# Patient Record
Sex: Male | Born: 1935 | Race: Black or African American | Hispanic: No | Marital: Married | Smoking: Former smoker
Health system: Southern US, Community
[De-identification: ages and names within clinical notes are randomized; demographics above are authoritative.]

## PROBLEM LIST (undated history)

## (undated) DIAGNOSIS — H269 Unspecified cataract: Secondary | ICD-10-CM

## (undated) DIAGNOSIS — I639 Cerebral infarction, unspecified: Secondary | ICD-10-CM

## (undated) DIAGNOSIS — E789 Disorder of lipoprotein metabolism, unspecified: Secondary | ICD-10-CM

## (undated) DIAGNOSIS — E119 Type 2 diabetes mellitus without complications: Secondary | ICD-10-CM

## (undated) DIAGNOSIS — M1990 Unspecified osteoarthritis, unspecified site: Secondary | ICD-10-CM

## (undated) DIAGNOSIS — I10 Essential (primary) hypertension: Secondary | ICD-10-CM

## (undated) DIAGNOSIS — N184 Chronic kidney disease, stage 4 (severe): Secondary | ICD-10-CM

## (undated) DIAGNOSIS — N289 Disorder of kidney and ureter, unspecified: Secondary | ICD-10-CM

## (undated) DIAGNOSIS — R413 Other amnesia: Secondary | ICD-10-CM

## (undated) HISTORY — DX: Cerebral infarction, unspecified: I63.9

## (undated) HISTORY — DX: Other amnesia: R41.3

## (undated) HISTORY — DX: Unspecified osteoarthritis, unspecified site: M19.90

## (undated) HISTORY — DX: Disorder of lipoprotein metabolism, unspecified: E78.9

## (undated) HISTORY — PX: HERNIA REPAIR: SHX51

## (undated) HISTORY — DX: Disorder of kidney and ureter, unspecified: N28.9

## (undated) HISTORY — PX: COLONOSCOPY: SHX174

---
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
## 2000-09-20 ENCOUNTER — Encounter: Payer: Self-pay | Admitting: Family Medicine

## 2000-09-20 ENCOUNTER — Encounter: Admission: RE | Admit: 2000-09-20 | Discharge: 2000-09-20 | Payer: Self-pay | Admitting: Family Medicine

## 2002-01-31 ENCOUNTER — Encounter: Admission: RE | Admit: 2002-01-31 | Discharge: 2002-01-31 | Payer: Self-pay | Admitting: Family Medicine

## 2002-01-31 ENCOUNTER — Encounter: Payer: Self-pay | Admitting: Family Medicine

## 2003-06-29 ENCOUNTER — Encounter: Admission: RE | Admit: 2003-06-29 | Discharge: 2003-06-29 | Payer: Self-pay | Admitting: Family Medicine

## 2003-11-06 ENCOUNTER — Ambulatory Visit (HOSPITAL_COMMUNITY): Admission: RE | Admit: 2003-11-06 | Discharge: 2003-11-06 | Payer: Self-pay | Admitting: Gastroenterology

## 2006-08-13 ENCOUNTER — Ambulatory Visit: Payer: Self-pay

## 2009-06-28 ENCOUNTER — Emergency Department (HOSPITAL_COMMUNITY): Admission: EM | Admit: 2009-06-28 | Discharge: 2009-06-28 | Payer: Self-pay | Admitting: Emergency Medicine

## 2010-01-19 ENCOUNTER — Inpatient Hospital Stay (HOSPITAL_COMMUNITY): Admission: EM | Admit: 2010-01-19 | Discharge: 2010-01-21 | Payer: Self-pay | Admitting: Emergency Medicine

## 2010-04-27 ENCOUNTER — Emergency Department (HOSPITAL_COMMUNITY): Admission: EM | Admit: 2010-04-27 | Discharge: 2010-04-27 | Payer: Self-pay | Admitting: Emergency Medicine

## 2010-10-19 LAB — BASIC METABOLIC PANEL
BUN: 31 mg/dL — ABNORMAL HIGH (ref 6–23)
BUN: 35 mg/dL — ABNORMAL HIGH (ref 6–23)
CO2: 21 mEq/L (ref 19–32)
Calcium: 8.5 mg/dL (ref 8.4–10.5)
Chloride: 111 mEq/L (ref 96–112)
GFR calc non Af Amer: 22 mL/min — ABNORMAL LOW (ref >60)
Glucose, Bld: 123 mg/dL — ABNORMAL HIGH (ref 70–99)
Glucose, Bld: 87 mg/dL (ref 70–99)
Potassium: 4.4 mEq/L (ref 3.5–5.1)
Sodium: 134 mEq/L — ABNORMAL LOW (ref 135–145)

## 2010-10-19 LAB — COMPREHENSIVE METABOLIC PANEL
ALT: 23 U/L (ref 0–53)
Alkaline Phosphatase: 72 U/L (ref 39–117)
BUN: 27 mg/dL — ABNORMAL HIGH (ref 6–23)
Calcium: 8.1 mg/dL — ABNORMAL LOW (ref 8.4–10.5)
GFR calc non Af Amer: 32 mL/min — ABNORMAL LOW (ref >60)
Glucose, Bld: 104 mg/dL — ABNORMAL HIGH (ref 70–99)
Sodium: 139 mEq/L (ref 135–145)
Total Bilirubin: 0.3 mg/dL (ref 0.3–1.2)
Total Protein: 6.2 g/dL (ref 6.0–8.3)

## 2010-10-19 LAB — DIFFERENTIAL
Basophils Absolute: 0 10*3/uL (ref 0.0–0.1)
Lymphocytes Relative: 11 % — ABNORMAL LOW (ref 12–46)
Lymphs Abs: 1.2 10*3/uL (ref 0.7–4.0)
Neutro Abs: 8.3 10*3/uL — ABNORMAL HIGH (ref 1.7–7.7)

## 2010-10-19 LAB — POCT I-STAT, CHEM 8
Creatinine, Ser: 3.1 mg/dL — ABNORMAL HIGH (ref 0.4–1.5)
HCT: 31 % — ABNORMAL LOW (ref 39.0–52.0)
Hemoglobin: 10.5 g/dL — ABNORMAL LOW (ref 13.0–17.0)
Potassium: 4.1 mEq/L (ref 3.5–5.1)
Sodium: 137 mEq/L (ref 135–145)
TCO2: 20 mmol/L (ref 0–100)

## 2010-10-19 LAB — URINALYSIS, MICROSCOPIC ONLY
Glucose, UA: NEGATIVE mg/dL
Protein, ur: NEGATIVE mg/dL

## 2010-10-19 LAB — URINALYSIS, ROUTINE W REFLEX MICROSCOPIC
Glucose, UA: NEGATIVE mg/dL
Ketones, ur: NEGATIVE mg/dL
Nitrite: POSITIVE — AB
Protein, ur: 100 mg/dL — AB
Urobilinogen, UA: 1 mg/dL (ref 0.0–1.0)

## 2010-10-19 LAB — CULTURE, BLOOD (ROUTINE X 2): Culture: NO GROWTH

## 2010-10-19 LAB — CBC
HCT: 27.1 % — ABNORMAL LOW (ref 39.0–52.0)
HCT: 27.4 % — ABNORMAL LOW (ref 39.0–52.0)
Hemoglobin: 9.1 g/dL — ABNORMAL LOW (ref 13.0–17.0)
MCHC: 33.6 g/dL (ref 30.0–36.0)
MCHC: 34.4 g/dL (ref 30.0–36.0)
MCV: 87.7 fL (ref 78.0–100.0)
Platelets: 184 10*3/uL (ref 150–400)
Platelets: 187 10*3/uL (ref 150–400)
RDW: 15 % (ref 11.5–15.5)
RDW: 15.1 % (ref 11.5–15.5)
RDW: 15.3 % (ref 11.5–15.5)
WBC: 11.4 10*3/uL — ABNORMAL HIGH (ref 4.0–10.5)
WBC: 9 10*3/uL (ref 4.0–10.5)

## 2010-10-19 LAB — URINE MICROSCOPIC-ADD ON

## 2010-10-19 LAB — FOLATE: Folate: 6.1 ng/mL

## 2010-10-19 LAB — URINE CULTURE: Colony Count: >=100000

## 2010-10-19 LAB — LIPID PANEL: HDL: 24 mg/dL — ABNORMAL LOW (ref >39)

## 2010-12-19 NOTE — Op Note (Signed)
NAME:  Kenneth Bennett, Kenneth Bennett                         ACCOUNT NO.:  [id number]   MEDICAL RECORD NO.:  [id number]                   PATIENT TYPE:  AMB   LOCATION:  ENDO                                 FACILITY:  [facility]   PHYSICIAN:  Wonda Horner, M.D.                DATE OF BIRTH:  [birth year]   DATE OF PROCEDURE:  11/06/2003  DATE OF DISCHARGE:                                 OPERATIVE REPORT   PROCEDURE PERFORMED:  Colonoscopy.   INDICATIONS FOR PROCEDURE:  Screening.   Informed consent was obtained after explanation of the risks of bleeding,  infection, and perforation.   PREMEDICATIONS:  Fentanyl 75 mcg  IV, Versed 10 mg IV.   DESCRIPTION OF PROCEDURE:  With the patient in the left lateral decubitus  position, a rectal exam was performed and no masses were felt.  The Olympus  colonoscope was inserted into the rectum and advanced around the colon to  the cecum.  Cecal landmarks were identified.  The cecum and ascending colon  were normal.  The transverse colon normal.  The descending colon, sigmoid  and rectum were normal.  The patient tolerated the procedure well without  complications.   IMPRESSION:  Normal colonoscopy to the cecum.                                               Wonda Horner, M.D.    Katherina Mires  D:  11/06/2003  T:  11/07/2003  Job:  [id number]   cc:   Lucianne Lei, M.D.  [phone] [address]  [place]  [place] [address]  Fax: [phone]

## 2011-10-19 DIAGNOSIS — N401 Enlarged prostate with lower urinary tract symptoms: Secondary | ICD-10-CM | POA: Diagnosis not present

## 2011-10-19 DIAGNOSIS — N289 Disorder of kidney and ureter, unspecified: Secondary | ICD-10-CM | POA: Diagnosis not present

## 2011-11-11 DIAGNOSIS — E1110 Type 2 diabetes mellitus with ketoacidosis without coma: Secondary | ICD-10-CM | POA: Diagnosis not present

## 2011-11-11 DIAGNOSIS — E119 Type 2 diabetes mellitus without complications: Secondary | ICD-10-CM | POA: Diagnosis not present

## 2011-11-11 DIAGNOSIS — I10 Essential (primary) hypertension: Secondary | ICD-10-CM | POA: Diagnosis not present

## 2011-11-11 DIAGNOSIS — N289 Disorder of kidney and ureter, unspecified: Secondary | ICD-10-CM | POA: Diagnosis not present

## 2011-11-11 DIAGNOSIS — E785 Hyperlipidemia, unspecified: Secondary | ICD-10-CM | POA: Diagnosis not present

## 2012-03-10 DIAGNOSIS — N289 Disorder of kidney and ureter, unspecified: Secondary | ICD-10-CM | POA: Diagnosis not present

## 2012-03-10 DIAGNOSIS — I10 Essential (primary) hypertension: Secondary | ICD-10-CM | POA: Diagnosis not present

## 2012-03-10 DIAGNOSIS — E785 Hyperlipidemia, unspecified: Secondary | ICD-10-CM | POA: Diagnosis not present

## 2012-03-10 DIAGNOSIS — E1110 Type 2 diabetes mellitus with ketoacidosis without coma: Secondary | ICD-10-CM | POA: Diagnosis not present

## 2012-09-21 DIAGNOSIS — H2510 Age-related nuclear cataract, unspecified eye: Secondary | ICD-10-CM | POA: Diagnosis not present

## 2012-11-11 ENCOUNTER — Other Ambulatory Visit: Payer: Self-pay | Admitting: Ophthalmology

## 2012-11-11 DIAGNOSIS — H2510 Age-related nuclear cataract, unspecified eye: Secondary | ICD-10-CM | POA: Diagnosis not present

## 2012-11-11 NOTE — H&P (Signed)
  Pre-operative History and Physical for Ophthalmic Surgery  Kenneth Bennett 11/11/2012                  Chief Complaint: Decresaed, "blurry" vision left eye. Having difficulty with glare from bright lights when driving at night, difficulty reading.                                 Vision : 20/70  With +0.50 +2.00 x 075   Manifest refraction  Diagnosis: Nuclear Sclerotic Cataract  Allergies not on file  No Known Allergies  Methyldopa 250 mg 2 po QD Crestor 1 po QD  Planned Procedure:                                       Phacoemulsification, Posterior Chamber Intra-ocular Lens Right Eye  There were no vitals filed for this visit.  Pulse: 67         Temp: NE        Resp:  17       ROS: negative  No past medical history on file.  No past surgical history on file.   History   Social History  . Marital Status: Married    Spouse Name: N/A    Number of Children: N/A  . Years of Education: N/A   Occupational History  . Not on file.   Social History Main Topics  . Smoking status: Not on file  . Smokeless tobacco: Not on file  . Alcohol Use: Not on file  . Drug Use: Not on file  . Sexually Active: Not on file   Other Topics Concern  . Not on file   Social History Narrative  . No narrative on file     The following examination is for anesthesia clearance for minimally invasive Ophthalmic surgery. It is primarily to document heart and lung findings and is not intended to elucidate unknown general medical conditions inclusive of abdominal masses, lung lesions, etc.   General Constitution:  within normal limits   Alertness/Orientation:  Person, time place     yes   HEENT:  Eye Findings: Nuclear Sclerotic Cataract                   left eye  Neck: supple without masses  Chest/Lungs: clear to auscultation  Cardiac: Normal S1 and S2 without Murmur, S3 or S4  Neuro: non-focal   Impression: Visually significant Cataract Left with decreased ability to drive at  night and complaint of blurred vision  Planned Procedure:  Phacoemulsification, Posterior Chamber Intraocular Lens Left Eye     Adonis Brook, MD

## 2012-11-15 ENCOUNTER — Encounter (HOSPITAL_COMMUNITY): Payer: Self-pay

## 2012-11-15 ENCOUNTER — Encounter (HOSPITAL_COMMUNITY): Payer: Self-pay | Admitting: *Deleted

## 2012-11-16 ENCOUNTER — Ambulatory Visit (HOSPITAL_COMMUNITY): Payer: Medicare Other

## 2012-11-16 ENCOUNTER — Encounter (HOSPITAL_COMMUNITY): Payer: Self-pay | Admitting: Anesthesiology

## 2012-11-16 ENCOUNTER — Encounter (HOSPITAL_COMMUNITY)
Admission: RE | Disposition: A | Discharge status: Home / Self Care | Payer: Self-pay | Source: Ambulatory Visit | Attending: Ophthalmology

## 2012-11-16 ENCOUNTER — Ambulatory Visit (HOSPITAL_COMMUNITY): Payer: Medicare Other | Admitting: Anesthesiology

## 2012-11-16 ENCOUNTER — Ambulatory Visit (HOSPITAL_COMMUNITY)
Admission: RE | Admit: 2012-11-16 | Discharge: 2012-11-16 | Disposition: A | Discharge status: Home / Self Care | Payer: Medicare Other | Source: Ambulatory Visit | Attending: Ophthalmology | Admitting: Ophthalmology

## 2012-11-16 ENCOUNTER — Encounter (HOSPITAL_COMMUNITY): Payer: Self-pay | Admitting: *Deleted

## 2012-11-16 DIAGNOSIS — H2510 Age-related nuclear cataract, unspecified eye: Secondary | ICD-10-CM | POA: Diagnosis not present

## 2012-11-16 HISTORY — DX: Essential (primary) hypertension: I10

## 2012-11-16 HISTORY — DX: Unspecified cataract: H26.9

## 2012-11-16 HISTORY — DX: Type 2 diabetes mellitus without complications: E11.9

## 2012-11-16 HISTORY — PX: CATARACT EXTRACTION W/PHACO: SHX586

## 2012-11-16 LAB — BASIC METABOLIC PANEL
BUN: 33 mg/dL — ABNORMAL HIGH (ref 6–23)
Calcium: 9.9 mg/dL (ref 8.4–10.5)
Creatinine, Ser: 2.13 mg/dL — ABNORMAL HIGH (ref 0.50–1.35)
GFR calc Af Amer: 33 mL/min — ABNORMAL LOW (ref >90)
GFR calc non Af Amer: 28 mL/min — ABNORMAL LOW (ref >90)
Glucose, Bld: 103 mg/dL — ABNORMAL HIGH (ref 70–99)
Potassium: 4.8 mEq/L (ref 3.5–5.1)

## 2012-11-16 LAB — GLUCOSE, CAPILLARY: Glucose-Capillary: 75 mg/dL (ref 70–99)

## 2012-11-16 LAB — CBC
Hemoglobin: 13 g/dL (ref 13.0–17.0)
MCH: 29 pg (ref 26.0–34.0)
MCHC: 34.9 g/dL (ref 30.0–36.0)
RDW: 14 % (ref 11.5–15.5)

## 2012-11-16 SURGERY — PHACOEMULSIFICATION, CATARACT, WITH IOL INSERTION
Anesthesia: Monitor Anesthesia Care | Site: Eye | Laterality: Left | Wound class: Clean

## 2012-11-16 MED ORDER — EPINEPHRINE HCL 1 MG/ML IJ SOLN
INTRAOCULAR | Status: DC | PRN
  Administered 2012-11-16: 14:00:00

## 2012-11-16 MED ORDER — FENTANYL CITRATE 0.05 MG/ML IJ SOLN
INTRAMUSCULAR | Status: DC | PRN
  Administered 2012-11-16: 25 ug via INTRAVENOUS
  Administered 2012-11-16: 50 ug via INTRAVENOUS
  Administered 2012-11-16: 25 ug via INTRAVENOUS

## 2012-11-16 MED ORDER — LACTATED RINGERS IV SOLN
INTRAVENOUS | Status: DC | PRN
  Administered 2012-11-16: 14:00:00 via INTRAVENOUS

## 2012-11-16 MED ORDER — DEXAMETHASONE SODIUM PHOSPHATE 10 MG/ML IJ SOLN
INTRAMUSCULAR | Status: AC
  Filled 2012-11-16: qty 1

## 2012-11-16 MED ORDER — DEXAMETHASONE SODIUM PHOSPHATE 10 MG/ML IJ SOLN
INTRAMUSCULAR | Status: DC | PRN
  Administered 2012-11-16: 10 mg

## 2012-11-16 MED ORDER — LIDOCAINE HCL 2 % IJ SOLN
INTRAMUSCULAR | Status: AC
  Filled 2012-11-16: qty 20

## 2012-11-16 MED ORDER — PREDNISOLONE ACETATE 1 % OP SUSP
1.0000 [drp] | OPHTHALMIC | Status: AC
  Administered 2012-11-16: 1 [drp] via OPHTHALMIC

## 2012-11-16 MED ORDER — TETRACAINE HCL 0.5 % OP SOLN
OPHTHALMIC | Status: AC
  Filled 2012-11-16: qty 2

## 2012-11-16 MED ORDER — PROVISC 10 MG/ML IO SOLN
INTRAOCULAR | Status: DC | PRN
  Administered 2012-11-16: .85 mL via INTRAOCULAR

## 2012-11-16 MED ORDER — LIDOCAINE HCL 2 % IJ SOLN
INTRAMUSCULAR | Status: DC | PRN
  Administered 2012-11-16: 14:00:00 via RETROBULBAR

## 2012-11-16 MED ORDER — MUPIROCIN 2 % EX OINT
TOPICAL_OINTMENT | CUTANEOUS | Status: AC
  Filled 2012-11-16: qty 22

## 2012-11-16 MED ORDER — EPINEPHRINE HCL 1 MG/ML IJ SOLN
INTRAMUSCULAR | Status: AC
  Filled 2012-11-16: qty 1

## 2012-11-16 MED ORDER — HYPROMELLOSE (GONIOSCOPIC) 2.5 % OP SOLN
OPHTHALMIC | Status: DC | PRN
  Administered 2012-11-16: 2 [drp] via OPHTHALMIC

## 2012-11-16 MED ORDER — WATER FOR IRRIGATION, STERILE IR SOLN
Status: DC | PRN
  Administered 2012-11-16: 1000 mL

## 2012-11-16 MED ORDER — BSS IO SOLN
INTRAOCULAR | Status: AC
  Filled 2012-11-16: qty 500

## 2012-11-16 MED ORDER — BUPIVACAINE HCL (PF) 0.75 % IJ SOLN
INTRAMUSCULAR | Status: AC
  Filled 2012-11-16: qty 10

## 2012-11-16 MED ORDER — SODIUM HYALURONATE 10 MG/ML IO SOLN
INTRAOCULAR | Status: AC
  Filled 2012-11-16: qty 0.85

## 2012-11-16 MED ORDER — NA CHONDROIT SULF-NA HYALURON 40-30 MG/ML IO SOLN
INTRAOCULAR | Status: DC | PRN
  Administered 2012-11-16 (×2): 0.5 mL via INTRAOCULAR

## 2012-11-16 MED ORDER — ACETAZOLAMIDE SODIUM 500 MG IJ SOLR
INTRAMUSCULAR | Status: AC
  Filled 2012-11-16: qty 500

## 2012-11-16 MED ORDER — NA CHONDROIT SULF-NA HYALURON 40-30 MG/ML IO SOLN
INTRAOCULAR | Status: AC
  Filled 2012-11-16: qty 0.5

## 2012-11-16 MED ORDER — CEFAZOLIN SUBCONJUNCTIVAL INJECTION 100 MG/0.5 ML
200.0000 mg | INJECTION | SUBCONJUNCTIVAL | Status: DC
  Filled 2012-11-16: qty 1

## 2012-11-16 MED ORDER — LACTATED RINGERS IV SOLN
INTRAVENOUS | Status: DC
  Administered 2012-11-16: 13:00:00 via INTRAVENOUS

## 2012-11-16 MED ORDER — CEFAZOLIN SUBCONJUNCTIVAL INJECTION 100 MG/0.5 ML
INJECTION | SUBCONJUNCTIVAL | Status: DC | PRN
  Administered 2012-11-16: 200 mg via SUBCONJUNCTIVAL

## 2012-11-16 MED ORDER — TRIAMCINOLONE ACETONIDE 40 MG/ML IJ SUSP
INTRAMUSCULAR | Status: AC
  Filled 2012-11-16: qty 5

## 2012-11-16 MED ORDER — PHENYLEPHRINE HCL 2.5 % OP SOLN
OPHTHALMIC | Status: AC
  Filled 2012-11-16: qty 3

## 2012-11-16 MED ORDER — TETRACAINE HCL 0.5 % OP SOLN
OPHTHALMIC | Status: DC | PRN
  Administered 2012-11-16: 2 [drp] via OPHTHALMIC

## 2012-11-16 MED ORDER — PHENYLEPHRINE HCL 2.5 % OP SOLN
1.0000 [drp] | OPHTHALMIC | Status: AC | PRN
  Administered 2012-11-16 (×3): 1 [drp] via OPHTHALMIC

## 2012-11-16 MED ORDER — HYPROMELLOSE (GONIOSCOPIC) 2.5 % OP SOLN
OPHTHALMIC | Status: AC
  Filled 2012-11-16: qty 15

## 2012-11-16 MED ORDER — BACITRACIN-POLYMYXIN B 500-10000 UNIT/GM OP OINT
TOPICAL_OINTMENT | OPHTHALMIC | Status: AC
  Filled 2012-11-16: qty 3.5

## 2012-11-16 MED ORDER — 0.9 % SODIUM CHLORIDE (POUR BTL) OPTIME
TOPICAL | Status: DC | PRN
  Administered 2012-11-16: 1000 mL

## 2012-11-16 MED ORDER — CEFAZOLIN SUBCONJUNCTIVAL INJECTION 100 MG/0.5 ML
100.0000 mg | INJECTION | SUBCONJUNCTIVAL | Status: DC
  Filled 2012-11-16: qty 0.5

## 2012-11-16 MED ORDER — PREDNISOLONE ACETATE 1 % OP SUSP
OPHTHALMIC | Status: AC
  Filled 2012-11-16: qty 5

## 2012-11-16 MED ORDER — GATIFLOXACIN 0.5 % OP SOLN
1.0000 [drp] | OPHTHALMIC | Status: AC | PRN
  Administered 2012-11-16 (×3): 1 [drp] via OPHTHALMIC
  Filled 2012-11-16: qty 2.5

## 2012-11-16 MED ORDER — PROPOFOL 10 MG/ML IV BOLUS
INTRAVENOUS | Status: DC | PRN
  Administered 2012-11-16 (×2): 20 mg via INTRAVENOUS
  Administered 2012-11-16: 40 mg via INTRAVENOUS

## 2012-11-16 MED ORDER — TETRACAINE HCL 0.5 % OP SOLN
1.0000 [drp] | OPHTHALMIC | Status: AC
  Administered 2012-11-16: 1 [drp] via OPHTHALMIC

## 2012-11-16 SURGICAL SUPPLY — 62 items
APPLICATOR COTTON TIP 6IN STRL (MISCELLANEOUS) ×2 IMPLANT
APPLICATOR DR MATTHEWS STRL (MISCELLANEOUS) ×2 IMPLANT
BAG MINI COLL DRAIN (WOUND CARE) ×2 IMPLANT
BLADE EYE MINI 60D BEAVER (BLADE) IMPLANT
BLADE KERATOME 2.75 (BLADE) ×2 IMPLANT
BLADE STAB KNIFE 15DEG (BLADE) IMPLANT
CANNULA ANTERIOR CHAMBER 27GA (MISCELLANEOUS) IMPLANT
CLOTH BEACON ORANGE TIMEOUT ST (SAFETY) ×2 IMPLANT
DRAPE OPHTHALMIC 77X100 STRL (CUSTOM PROCEDURE TRAY) ×2 IMPLANT
DRAPE POUCH INSTRU U-SHP 10X18 (DRAPES) ×2 IMPLANT
DRSG TEGADERM 4X4.75 (GAUZE/BANDAGES/DRESSINGS) ×2 IMPLANT
FILTER BLUE MILLIPORE (MISCELLANEOUS) IMPLANT
GLOVE ECLIPSE 6.5 STRL STRAW (GLOVE) ×2 IMPLANT
GLOVE SS BIOGEL STRL SZ 6.5 (GLOVE) ×1 IMPLANT
GLOVE SUPERSENSE BIOGEL SZ 6.5 (GLOVE) ×1
GOWN SRG XL XLNG 56XLVL 4 (GOWN DISPOSABLE) ×1 IMPLANT
GOWN STRL NON-REIN LRG LVL3 (GOWN DISPOSABLE) ×2 IMPLANT
GOWN STRL NON-REIN XL XLG LVL4 (GOWN DISPOSABLE) ×1
KIT BASIN OR (CUSTOM PROCEDURE TRAY) ×2 IMPLANT
KIT ROOM TURNOVER OR (KITS) IMPLANT
KNIFE GRIESHABER SHARP 2.5MM (MISCELLANEOUS) ×2 IMPLANT
LENS IOL ACRYSOF MP POST 17.5 (Intraocular Lens) ×2 IMPLANT
MASK EYE SHIELD (GAUZE/BANDAGES/DRESSINGS) ×2 IMPLANT
NEEDLE 18GX1X1/2 (RX/OR ONLY) (NEEDLE) ×4 IMPLANT
NEEDLE 22X1 1/2 (OR ONLY) (NEEDLE) ×2 IMPLANT
NEEDLE 25GX 5/8IN NON SAFETY (NEEDLE) ×2 IMPLANT
NEEDLE FILTER BLUNT 18X 1/2SAF (NEEDLE) ×1
NEEDLE FILTER BLUNT 18X1 1/2 (NEEDLE) ×1 IMPLANT
NEEDLE HYPO 30X.5 LL (NEEDLE) ×4 IMPLANT
NS IRRIG 1000ML POUR BTL (IV SOLUTION) ×2 IMPLANT
PACK CATARACT CUSTOM (CUSTOM PROCEDURE TRAY) ×2 IMPLANT
PACK CATARACT MCHSCP (PACKS) ×2 IMPLANT
PACK COMBINED CATERACT/VIT 23G (OPHTHALMIC RELATED) IMPLANT
PAD ARMBOARD 7.5X6 YLW CONV (MISCELLANEOUS) ×2 IMPLANT
PAD EYE OVAL STERILE LF (GAUZE/BANDAGES/DRESSINGS) ×2 IMPLANT
PHACO TIP KELMAN 45DEG (TIP) IMPLANT
PROBE ANTERIOR 20G W/INFUS NDL (MISCELLANEOUS) IMPLANT
RING MALYGIN (MISCELLANEOUS) IMPLANT
ROLLS DENTAL (MISCELLANEOUS) IMPLANT
SHUTTLE MONARCH TYPE A (NEEDLE) ×2 IMPLANT
SOLUTION ANTI FOG 6CC (MISCELLANEOUS) IMPLANT
SPEAR EYE SURG WECK-CEL (MISCELLANEOUS) ×2 IMPLANT
SUT ETHILON 10-0 CS-B-6CS-B-6 (SUTURE)
SUT ETHILON 5 0 P 3 18 (SUTURE)
SUT ETHILON 9 0 TG140 8 (SUTURE) IMPLANT
SUT NYLON ETHILON 5-0 P-3 1X18 (SUTURE) IMPLANT
SUT PLAIN 6 0 TG1408 (SUTURE) IMPLANT
SUT POLY NON ABSORB 10-0 8 STR (SUTURE) IMPLANT
SUT VICRYL 6 0 S 29 12 (SUTURE) IMPLANT
SUTURE EHLN 10-0 CS-B-6CS-B-6 (SUTURE) IMPLANT
SYR 20CC LL (SYRINGE) IMPLANT
SYR 5ML LL (SYRINGE) IMPLANT
SYR TB 1ML LUER SLIP (SYRINGE) IMPLANT
SYRINGE 10CC LL (SYRINGE) IMPLANT
TAPE SURG TRANSPORE 1 IN (GAUZE/BANDAGES/DRESSINGS) ×1 IMPLANT
TAPE SURGICAL TRANSPORE 1 IN (GAUZE/BANDAGES/DRESSINGS) ×1
TIP ABS 45DEG FLARED 0.9MM (TIP) ×2 IMPLANT
TOWEL OR 17X24 6PK STRL BLUE (TOWEL DISPOSABLE) ×4 IMPLANT
WATER STERILE IRR 1000ML POUR (IV SOLUTION) ×2 IMPLANT
WIPE INSTRUMENT ADHESIVE BACK (MISCELLANEOUS) ×2 IMPLANT
WIPE INSTRUMENT VISIWIPE 73X73 (MISCELLANEOUS) ×2 IMPLANT
lens (Intraocular Lens) ×2 IMPLANT

## 2012-11-16 NOTE — Transfer of Care (Signed)
Immediate Anesthesia Transfer of Care Note  Patient: Kenneth Bennett  Procedure(s) Performed: Procedure(s): CATARACT EXTRACTION PHACO AND INTRAOCULAR LENS PLACEMENT (I[facility]) (Left)  Patient Location: PACU  Anesthesia Type:MAC  Level of Consciousness: awake, alert , oriented and sedated  Airway & Oxygen Therapy: Patient Spontanous Breathing  Post-op Assessment: Report given to PACU RN, Post -op Vital signs reviewed and stable and Patient moving all extremities  Post vital signs: Reviewed and stable  Complications: No apparent anesthesia complications

## 2012-11-16 NOTE — Preoperative (Signed)
Beta Blockers   Reason not to administer Beta Blockers:Not Applicable 

## 2012-11-16 NOTE — Interval H&P Note (Signed)
History and Physical Interval Note:  11/16/2012 1:08 PM  Kenneth Bennett  has presented today for surgery, with the diagnosis of Nuclear Cataract Left Eye  The various methods of treatment have been discussed with the patient and family. After consideration of risks, benefits and other options for treatment, the patient has consented to  Procedure(s): CATARACT EXTRACTION PHACO AND INTRAOCULAR LENS PLACEMENT ([place]) (Left) as a surgical intervention .  The patient's history has been reviewed, patient examined, no change in status, stable for surgery.  I have reviewed the patient's chart and labs.  Questions were answered to the patient's satisfaction.    As above , vision remains best corrected at 20/70 Left Eye. The AScan IOL measurement indicates a lens power of +17.50 for emmetropic correction OS. Risks, benefits and alternatives to Cataract surgery have been discussed and the patient strongly desires to proceed with Cataract surgery OS.    [name], Lavella Hammock

## 2012-11-16 NOTE — Op Note (Signed)
Kenneth Bennett 11/16/2012 Cataract: Combined, Nuclear  Procedure: Phacoemulsification, Posterior Chamber Intra-ocular Lens Operative Eye:  left eye  Surgeon: Adonis Brook Estimated Blood Loss: minimal Specimens for Pathology:  None Complications: none  The patient was prepared and draped in the usual manner for ocular surgery on the left eye. A Cook lid speculum was placed. A peripheral clear corneal incision was made at the surgical limbus centered at the 11:00 meridian. A separate clear corneal stab incision was made with a 15 degree blade at the 2:00 meridian to permit bi-manual technique. Viscoat and  Provisc as an underlying layer next to the capsule was instilled into the anterior chamber through that incision.  A keratome was used to create a self sealing incision entering the anterior chamber at the 11:00 meridian. A capsulorhexis was performed using a bent 25g needle. The lens was hydrodissected and the nucleus was hydrodilineated using a Nichammin cannula. The Chang chopper was inserted and used to rotate the lens to insure adequate lens mobility. The phacoemulsification handpiece was inserted and a combined phaco-chop technique was employed, fracturing the lens into separate sections with subsequent removal with the phaco handpiece.   The I/A cannula was used to remove remaining lens cortex. Provisc was instilled and used to deepen the anterior chamber and posterior capsule bag. The Monarch injector was used to place a folded Acrysof MA50BM PC IOL, + 17.50  diopters, into the capsule bag. A Sinskey lens hook was used to dial in the trailing haptic.  The I/A cannula was used to remove the viscoelastic from the anterior chamber. BSS was used to bring IOP to the desired range and the wound was checked to insure it was watertight. Subconjunctival injections of Ancef 100/0.39ml and Dexamethasone 0.5 ml of a 10mg /28ml solution were placed without complication. The lid speculum and drapes were  removed and the patient's eye was patched with Polymixin/Bacitracin ophthalmic ointment. An eye shield was placed and the patient was transferred alert and conversant from the operating room to the post-operative recovery area.   Adonis Brook, MD

## 2012-11-16 NOTE — Anesthesia Procedure Notes (Signed)
Procedure Name: MAC Date/Time: 11/16/2012 1:49 PM Performed by: Scheryl Darter Pre-anesthesia Checklist: Patient identified, Emergency Drugs available, Suction available, Patient being monitored and Timeout performed Patient Re-evaluated:Patient Re-evaluated prior to inductionOxygen Delivery Method: Nasal cannula Intubation Type: IV induction Placement Confirmation: positive ETCO2

## 2012-11-16 NOTE — H&P (View-Only) (Signed)
  Pre-operative History and Physical for Ophthalmic Surgery  Kenneth Bennett 11/11/2012                  Chief Complaint: Decresaed, "blurry" vision left eye. Having difficulty with glare from bright lights when driving at night, difficulty reading.                                 Vision : 20/70  With +0.50 +2.00 x 075   Manifest refraction  Diagnosis: Nuclear Sclerotic Cataract  Allergies not on file  No Known Allergies  Methyldopa 250 mg 2 po QD Crestor 1 po QD  Planned Procedure:                                       Phacoemulsification, Posterior Chamber Intra-ocular Lens Right Eye  There were no vitals filed for this visit.  Pulse: 67         Temp: NE        Resp:  17       ROS: negative  No past medical history on file.  No past surgical history on file.   History   Social History  . Marital Status: Married    Spouse Name: N/A    Number of Children: N/A  . Years of Education: N/A   Occupational History  . Not on file.   Social History Main Topics  . Smoking status: Not on file  . Smokeless tobacco: Not on file  . Alcohol Use: Not on file  . Drug Use: Not on file  . Sexually Active: Not on file   Other Topics Concern  . Not on file   Social History Narrative  . No narrative on file     The following examination is for anesthesia clearance for minimally invasive Ophthalmic surgery. It is primarily to document heart and lung findings and is not intended to elucidate unknown general medical conditions inclusive of abdominal masses, lung lesions, etc.   General Constitution:  within normal limits   Alertness/Orientation:  Person, time place     yes   HEENT:  Eye Findings: Nuclear Sclerotic Cataract                   left eye  Neck: supple without masses  Chest/Lungs: clear to auscultation  Cardiac: Normal S1 and S2 without Murmur, S3 or S4  Neuro: non-focal   Impression: Visually significant Cataract Left with decreased ability to drive at  night and complaint of blurred vision  Planned Procedure:  Phacoemulsification, Posterior Chamber Intraocular Lens Left Eye     Adonis Brook, MD

## 2012-11-16 NOTE — Anesthesia Postprocedure Evaluation (Signed)
Anesthesia Post Note  Patient: Kenneth Bennett  Procedure(s) Performed: Procedure(s) (LRB): CATARACT EXTRACTION PHACO AND INTRAOCULAR LENS PLACEMENT ([facility]) (Left)  Anesthesia type: MAC  Patient location: PACU  Post pain: Pain level controlled  Post assessment: Patient's Cardiovascular Status Stable  Last Vitals:  Filed Vitals:   11/16/12 1533  BP: 142/73  Pulse: 62  Temp: 36.7 C  Resp: 16    Post vital signs: Reviewed and stable  Level of consciousness: sedated  Complications: No apparent anesthesia complications

## 2012-11-16 NOTE — Anesthesia Preprocedure Evaluation (Signed)
Anesthesia Evaluation    Reviewed: Allergy & Precautions, H&P , NPO status , Patient's Chart, lab work & pertinent test results  History of Anesthesia Complications Negative for: history of anesthetic complications  Airway       Dental   Pulmonary neg pulmonary ROS,          Cardiovascular hypertension, Pt. on medications     Neuro/Psych negative neurological ROS  negative psych ROS   GI/Hepatic Neg liver ROS,   Endo/Other  diabetes  Renal/GU negative Renal ROS     Musculoskeletal   Abdominal   Peds  Hematology negative hematology ROS (+)   Anesthesia Other Findings   Reproductive/Obstetrics                           Anesthesia Physical Anesthesia Plan  ASA: III  Anesthesia Plan: MAC   Post-op Pain Management:    Induction: Intravenous  Airway Management Planned: Simple Face Mask  Additional Equipment:   Intra-op Plan:   Post-operative Plan:   Informed Consent:   Plan Discussed with: CRNA, Anesthesiologist and Surgeon  Anesthesia Plan Comments:         Anesthesia Quick Evaluation

## 2012-11-17 ENCOUNTER — Encounter (HOSPITAL_COMMUNITY): Payer: Self-pay | Admitting: Ophthalmology

## 2013-01-11 ENCOUNTER — Emergency Department (HOSPITAL_COMMUNITY): Payer: Medicare Other

## 2013-01-11 ENCOUNTER — Encounter (HOSPITAL_COMMUNITY): Payer: Self-pay | Admitting: *Deleted

## 2013-01-11 ENCOUNTER — Emergency Department (HOSPITAL_COMMUNITY)
Admission: EM | Admit: 2013-01-11 | Discharge: 2013-01-11 | Disposition: A | Discharge status: Home / Self Care | Payer: Medicare Other | Attending: Emergency Medicine | Admitting: Emergency Medicine

## 2013-01-11 DIAGNOSIS — E119 Type 2 diabetes mellitus without complications: Secondary | ICD-10-CM | POA: Diagnosis not present

## 2013-01-11 DIAGNOSIS — I10 Essential (primary) hypertension: Secondary | ICD-10-CM | POA: Diagnosis not present

## 2013-01-11 DIAGNOSIS — Z79899 Other long term (current) drug therapy: Secondary | ICD-10-CM | POA: Insufficient documentation

## 2013-01-11 DIAGNOSIS — R079 Chest pain, unspecified: Secondary | ICD-10-CM | POA: Diagnosis not present

## 2013-01-11 DIAGNOSIS — Z87891 Personal history of nicotine dependence: Secondary | ICD-10-CM | POA: Insufficient documentation

## 2013-01-11 DIAGNOSIS — Z9849 Cataract extraction status, unspecified eye: Secondary | ICD-10-CM | POA: Insufficient documentation

## 2013-01-11 DIAGNOSIS — R0789 Other chest pain: Secondary | ICD-10-CM | POA: Diagnosis not present

## 2013-01-11 DIAGNOSIS — Z7982 Long term (current) use of aspirin: Secondary | ICD-10-CM | POA: Insufficient documentation

## 2013-01-11 LAB — BASIC METABOLIC PANEL
Calcium: 9.4 mg/dL (ref 8.4–10.5)
GFR calc non Af Amer: 30 mL/min — ABNORMAL LOW (ref >90)
Sodium: 138 mEq/L (ref 135–145)

## 2013-01-11 LAB — CBC
MCH: 28.8 pg (ref 26.0–34.0)
MCHC: 33.7 g/dL (ref 30.0–36.0)
Platelets: 179 10*3/uL (ref 150–400)

## 2013-01-11 LAB — POCT I-STAT TROPONIN I: Troponin i, poc: 0 ng/mL (ref 0.00–0.08)

## 2013-01-11 NOTE — ED Notes (Signed)
PT states that he had this pain that came up under his left axilla area and now just has some slight heaviness.  Pt denies sob, but felt nauseated.

## 2013-01-11 NOTE — ED Notes (Signed)
Pt. returned from XR. 

## 2013-01-11 NOTE — ED Notes (Signed)
Pt transported to XR.  

## 2013-01-11 NOTE — ED Notes (Signed)
Pt states understanding of discharge instructions 

## 2013-01-11 NOTE — ED Provider Notes (Signed)
History     CSN: [id number]  Arrival date & time 01/11/13  1433   First MD Initiated Contact with Patient 01/11/13 1746      Chief Complaint  Patient presents with  . Chest Pain    (Consider location/radiation/quality/duration/timing/severity/associated sxs/prior treatment) HPI Patient presents with concerns of chest pain.  Pain began today, though he has had similar episodes for the past few weeks.  The pain is focally about the left upper chest, left axilla. The pain is described as heavy, nonradiating, with no clear precipitant or Episodes are brief, occur without specific prodrome, and cease without clear interventions.  There is no associated exertional or pleuritic discomfort.  No associated lightheadedness, syncope, unilateral weakness.  Past Medical History  Diagnosis Date  . Hypertension   . Cataracts, both eyes     Hx: of  . Diabetes mellitus without complication     Past Surgical History  Procedure Laterality Date  . Hernia repair    . Colonoscopy      Hx: of  . Cataract extraction w/phaco Left 11/16/2012    Procedure: CATARACT EXTRACTION PHACO AND INTRAOCULAR LENS PLACEMENT ([facility]);  Surgeon: Adonis Brook, MD;  Location: [facility];  Service: Ophthalmology;  Laterality: Left;    No family history on file.  History  Substance Use Topics  . Smoking status: Former Smoker    Quit date: 12/02/1983  . Smokeless tobacco: Not on file  . Alcohol Use: Yes     Comment: occasional      Review of Systems  Constitutional:       Per HPI, otherwise negative  HENT:       Per HPI, otherwise negative  Respiratory:       Per HPI, otherwise negative  Cardiovascular:       Per HPI, otherwise negative  Gastrointestinal: Negative for vomiting.  Endocrine:       Negative aside from HPI  Genitourinary:       Neg aside from HPI   Musculoskeletal:       Per HPI, otherwise negative  Skin: Negative.   Neurological: Negative for syncope.    Allergies  Review of patient's  allergies indicates no known allergies.  Home Medications   Current Outpatient Rx  Name  Route  Sig  Dispense  Refill  . aspirin EC 81 MG tablet   Oral   Take 81 mg by mouth daily.         . methyldopa (ALDOMET) 250 MG tablet   Oral   Take 500 mg by mouth daily with breakfast.          . rosuvastatin (CRESTOR) 20 MG tablet   Oral   Take 20 mg by mouth daily with breakfast.            BP 158/72  Pulse 57  Temp(Src) 98.4 F (36.9 C) (Oral)  Resp 19  SpO2 99%  Physical Exam  Nursing note and vitals reviewed. Constitutional: He is oriented to person, place, and time. He appears well-developed. No distress.  HENT:  Head: Normocephalic and atraumatic.  Eyes: Conjunctivae and EOM are normal.  Cardiovascular: Normal rate and regular rhythm.   Pulmonary/Chest: Effort normal. No stridor. No respiratory distress.  Abdominal: He exhibits no distension.  Musculoskeletal: He exhibits no edema.  Neurological: He is alert and oriented to person, place, and time.  Skin: Skin is warm and dry.  Psychiatric: He has a normal mood and affect.    ED Course  Procedures (including critical care  time)  Labs Reviewed  CBC - Abnormal; Notable for the following:    RBC 3.92 (*)    Hemoglobin 11.3 (*)    HCT 33.5 (*)    RDW 15.6 (*)    All other components within normal limits  BASIC METABOLIC PANEL - Abnormal; Notable for the following:    Potassium 5.4 (*)    BUN 32 (*)    Creatinine, Ser 2.00 (*)    GFR calc non Af Amer 30 (*)    GFR calc Af Amer 35 (*)    All other components within normal limits  POCT I-STAT TROPONIN I   No results found.   No diagnosis found.  O2- 99%ra, normal  Cardiac: 70sr, normal   Date: 01/11/2013  Rate: 73  Rhythm: normal sinus rhythm  QRS Axis: left  Intervals: PR prolonged  ST/T Wave abnormalities: normal  Conduction Disutrbances:first-degree A-V block   Narrative Interpretation:   Old EKG Reviewed: unchanged ABNORMAL  9:21  PM On repeat exam patient appears calm.  He states that he has no pain.  I discussed all results with him and his wife are they state that they're ready to go. MDM  Patient presents with weeks of left-sided intermittent chest pressure.  On my exam the patient has no complaints and was stable, and no tenderness to palpation.  There is low suspicion for ACS were given the minor degree of pain in the absence of a complaints while here, the reassuring labs and ECG.  With the patient's ongoing pain for weeks, true coronary ischemia will likely result in a positive troponin. However, given the patient's age, the lack of recent or any cardiac evaluation, he was advised to call his physician tomorrow, for cardiac evaluation.  He states that he can do this the        Carmin Muskrat, MD 01/11/13 2122

## 2013-01-11 NOTE — ED Notes (Signed)
RN explained plan of care to pt and family.

## 2013-04-07 DIAGNOSIS — E119 Type 2 diabetes mellitus without complications: Secondary | ICD-10-CM | POA: Diagnosis not present

## 2013-04-07 DIAGNOSIS — I10 Essential (primary) hypertension: Secondary | ICD-10-CM | POA: Diagnosis not present

## 2013-05-09 DIAGNOSIS — E119 Type 2 diabetes mellitus without complications: Secondary | ICD-10-CM | POA: Diagnosis not present

## 2013-05-09 DIAGNOSIS — N289 Disorder of kidney and ureter, unspecified: Secondary | ICD-10-CM | POA: Diagnosis not present

## 2013-05-09 DIAGNOSIS — I10 Essential (primary) hypertension: Secondary | ICD-10-CM | POA: Diagnosis not present

## 2013-05-09 DIAGNOSIS — E785 Hyperlipidemia, unspecified: Secondary | ICD-10-CM | POA: Diagnosis not present

## 2013-08-07 DIAGNOSIS — I10 Essential (primary) hypertension: Secondary | ICD-10-CM | POA: Diagnosis not present

## 2013-08-07 DIAGNOSIS — E119 Type 2 diabetes mellitus without complications: Secondary | ICD-10-CM | POA: Diagnosis not present

## 2013-08-08 DIAGNOSIS — E119 Type 2 diabetes mellitus without complications: Secondary | ICD-10-CM | POA: Diagnosis not present

## 2013-08-08 DIAGNOSIS — E7800 Pure hypercholesterolemia, unspecified: Secondary | ICD-10-CM | POA: Diagnosis not present

## 2013-08-08 DIAGNOSIS — I10 Essential (primary) hypertension: Secondary | ICD-10-CM | POA: Diagnosis not present

## 2013-08-08 DIAGNOSIS — N289 Disorder of kidney and ureter, unspecified: Secondary | ICD-10-CM | POA: Diagnosis not present

## 2013-12-06 DIAGNOSIS — N289 Disorder of kidney and ureter, unspecified: Secondary | ICD-10-CM | POA: Diagnosis not present

## 2013-12-06 DIAGNOSIS — Z125 Encounter for screening for malignant neoplasm of prostate: Secondary | ICD-10-CM | POA: Diagnosis not present

## 2013-12-06 DIAGNOSIS — E119 Type 2 diabetes mellitus without complications: Secondary | ICD-10-CM | POA: Diagnosis not present

## 2013-12-06 DIAGNOSIS — I10 Essential (primary) hypertension: Secondary | ICD-10-CM | POA: Diagnosis not present

## 2013-12-06 DIAGNOSIS — M109 Gout, unspecified: Secondary | ICD-10-CM | POA: Diagnosis not present

## 2014-04-06 DIAGNOSIS — E119 Type 2 diabetes mellitus without complications: Secondary | ICD-10-CM | POA: Diagnosis not present

## 2014-04-06 DIAGNOSIS — I10 Essential (primary) hypertension: Secondary | ICD-10-CM | POA: Diagnosis not present

## 2014-08-07 DIAGNOSIS — E782 Mixed hyperlipidemia: Secondary | ICD-10-CM | POA: Diagnosis not present

## 2014-08-07 DIAGNOSIS — I10 Essential (primary) hypertension: Secondary | ICD-10-CM | POA: Diagnosis not present

## 2014-08-07 DIAGNOSIS — E0800 Diabetes mellitus due to underlying condition with hyperosmolarity without nonketotic hyperglycemic-hyperosmolar coma (NKHHC): Secondary | ICD-10-CM | POA: Diagnosis not present

## 2014-08-07 DIAGNOSIS — N189 Chronic kidney disease, unspecified: Secondary | ICD-10-CM | POA: Diagnosis not present

## 2014-12-05 ENCOUNTER — Other Ambulatory Visit: Payer: Self-pay | Admitting: Family Medicine

## 2014-12-05 DIAGNOSIS — I739 Peripheral vascular disease, unspecified: Secondary | ICD-10-CM

## 2014-12-05 DIAGNOSIS — I10 Essential (primary) hypertension: Secondary | ICD-10-CM | POA: Diagnosis not present

## 2014-12-05 DIAGNOSIS — E0800 Diabetes mellitus due to underlying condition with hyperosmolarity without nonketotic hyperglycemic-hyperosmolar coma (NKHHC): Secondary | ICD-10-CM | POA: Diagnosis not present

## 2014-12-05 DIAGNOSIS — E782 Mixed hyperlipidemia: Secondary | ICD-10-CM | POA: Diagnosis not present

## 2014-12-05 DIAGNOSIS — N189 Chronic kidney disease, unspecified: Secondary | ICD-10-CM | POA: Diagnosis not present

## 2014-12-06 ENCOUNTER — Ambulatory Visit
Admission: RE | Admit: 2014-12-06 | Discharge: 2014-12-06 | Disposition: A | Discharge status: Home / Self Care | Payer: Medicare Other | Source: Ambulatory Visit | Attending: Family Medicine | Admitting: Family Medicine

## 2014-12-06 DIAGNOSIS — I739 Peripheral vascular disease, unspecified: Secondary | ICD-10-CM | POA: Diagnosis not present

## 2014-12-20 DIAGNOSIS — I10 Essential (primary) hypertension: Secondary | ICD-10-CM | POA: Diagnosis not present

## 2014-12-20 DIAGNOSIS — I7389 Other specified peripheral vascular diseases: Secondary | ICD-10-CM | POA: Diagnosis not present

## 2014-12-20 DIAGNOSIS — E0800 Diabetes mellitus due to underlying condition with hyperosmolarity without nonketotic hyperglycemic-hyperosmolar coma (NKHHC): Secondary | ICD-10-CM | POA: Diagnosis not present

## 2014-12-20 DIAGNOSIS — N189 Chronic kidney disease, unspecified: Secondary | ICD-10-CM | POA: Diagnosis not present

## 2015-01-18 ENCOUNTER — Encounter: Payer: Self-pay | Admitting: Vascular Surgery

## 2015-01-22 ENCOUNTER — Ambulatory Visit (INDEPENDENT_AMBULATORY_CARE_PROVIDER_SITE_OTHER): Payer: Medicare Other | Admitting: Vascular Surgery

## 2015-01-22 ENCOUNTER — Encounter (INDEPENDENT_AMBULATORY_CARE_PROVIDER_SITE_OTHER): Payer: Self-pay

## 2015-01-22 ENCOUNTER — Encounter: Payer: Self-pay | Admitting: Vascular Surgery

## 2015-01-22 VITALS — BP 148/82 | HR 74 | Resp 16 | Ht 70.0 in | Wt 193.0 lb

## 2015-01-22 DIAGNOSIS — M79606 Pain in leg, unspecified: Secondary | ICD-10-CM

## 2015-01-22 NOTE — Progress Notes (Signed)
Patient name: Kenneth Bennett MRN: [id number] DOB: [birth year] Sex: male   Referred by: Kenneth Bennett  Reason for referral:  Chief Complaint  Patient presents with  . New Evaluation    bilateral leg pain    HISTORY OF PRESENT ILLNESS: The patient is today for discussion of the lower extremity symptoms. He is a very pleasant 81 year old gentleman who appears younger than stated age. He reports that when he is walking he has difficulty with discomfort in the popliteal fossa and into his calves bilaterally. He reports that this occurs soon after walking even if he continues that this resolves and he can continue to walk without difficulty for longer period of time. He specifically denies any prior history of rest pain or tissue loss. He denies any history of cardiac disease and no history of stroke.  Past Medical History  Diagnosis Date  . Hypertension   . Cataracts, both eyes     Hx: of  . Diabetes mellitus without complication     Past Surgical History  Procedure Laterality Date  . Hernia repair    . Colonoscopy      Hx: of  . Cataract extraction w/phaco Left 11/16/2012    Procedure: CATARACT EXTRACTION PHACO AND INTRAOCULAR LENS PLACEMENT ([facility]);  Surgeon: Adonis Brook, MD;  Location: [facility];  Service: Ophthalmology;  Laterality: Left;    History   Social History  . Marital Status: Married    Spouse Name: N/A  . Number of Children: N/A  . Years of Education: N/A   Occupational History  . Not on file.   Social History Main Topics  . Smoking status: Former Smoker    Quit date: 12/02/1983  . Smokeless tobacco: Never Used  . Alcohol Use: 0.0 oz/week    0 Standard drinks or equivalent per week     Comment: occasional  . Drug Use: No  . Sexual Activity: Not on file   Other Topics Concern  . Not on file   Social History Narrative    No family history on file.  Allergies as of 01/22/2015  . (No Known Allergies)    Current Outpatient Prescriptions on File Prior to  Visit  Medication Sig Dispense Refill  . aspirin EC 81 MG tablet Take 81 mg by mouth daily.    . methyldopa (ALDOMET) 250 MG tablet Take 500 mg by mouth daily with breakfast.     . rosuvastatin (CRESTOR) 20 MG tablet Take 20 mg by mouth daily with breakfast.      No current facility-administered medications on file prior to visit.     REVIEW OF SYSTEMS:  Positives indicated with an "X"  CARDIOVASCULAR:  [ ]  chest pain   [ ]  chest pressure   [ ]  palpitations   [ ]  orthopnea   [ ]  dyspnea on exertion   [ ]  claudication   [ ]  rest pain   [ ]  DVT   [ ]  phlebitis PULMONARY:   [ ]  productive cough   [ ]  asthma   [ ]  wheezing NEUROLOGIC:   [x ] weakness  [ ]  paresthesias  [ ]  aphasia  [ ]  amaurosis  [ ]  dizziness HEMATOLOGIC:   [ ]  bleeding problems   [ ]  clotting disorders MUSCULOSKELETAL:  [ ]  joint pain   [ ]  joint swelling GASTROINTESTINAL: [ ]   blood in stool  [ ]   hematemesis GENITOURINARY:  [ ]   dysuria  [ ]   hematuria PSYCHIATRIC:  [ ]  history of  major depression INTEGUMENTARY:  [ ]  rashes  [ ]  ulcers CONSTITUTIONAL:  [ ]  fever   [ ]  chills  PHYSICAL EXAMINATION:  General: The patient is a well-nourished male, in no acute distress. Vital signs are BP 148/82 mmHg  Pulse 74  Resp 16  Ht 5\' 10"  (1.778 m)  Wt 193 lb (87.544 kg)  BMI 27.69 kg/m2 Pulmonary: There is a good air exchange bilaterally without wheezing or rales. Abdomen: Soft and non-tender with normal pitch bowel sounds. Musculoskeletal: There are no major deformities.  There is no significant extremity pain. Neurologic: No focal weakness or paresthesias are detected, Skin: There are no ulcer or rashes noted. Psychiatric: The patient has normal affect. Cardiovascular: There is a regular rate and rhythm without significant murmur appreciated. Pulse status 2+ radial 2+ femoral and 2+ dorsalis pedis pulses bilaterally Carotid arteries without bruits bilaterally   Vascular Lab Studies:  I have a copy of his  noninvasive vascular lab studies from [facility] radiology from 12/06/2014 this shows normal ankle arm index bilaterally. He has triphasic signals in his left posterior tibial and dorsalis pedis and biphasic signal in his right dorsalis pedis and monophasic in his posterior tibial.  Impression and Plan:  Had long discussion with patient. I do not feel that his symptoms are related to arterial insufficiency. He does have more difficulties related to his knees. 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 He reports that he has been told not to take non-steroidal an inflammatory but I do not have the reason for this. Asked that he has no risk for tissue loss and do not feel that there would be any advantage to further diagnostic treatment with normal ankle arm index bilaterally. He was reassured this discussion will see [place] again on as-needed basis    [name] [facility] Office: [phone]

## 2015-01-22 NOTE — Progress Notes (Signed)
Filed Vitals:   01/22/15 1304 01/22/15 1306  BP: 148/71 148/82  Pulse: 76 74  Resp: 16   Height: 5\' 10"  (1.778 m)   Weight: 193 lb (87.544 kg)    Body mass index is 27.69 kg/(m^2).

## 2015-01-24 ENCOUNTER — Encounter: Payer: Self-pay | Admitting: Family Medicine

## 2015-01-31 DIAGNOSIS — N189 Chronic kidney disease, unspecified: Secondary | ICD-10-CM | POA: Diagnosis not present

## 2015-01-31 DIAGNOSIS — M130 Polyarthritis, unspecified: Secondary | ICD-10-CM | POA: Diagnosis not present

## 2015-01-31 DIAGNOSIS — E782 Mixed hyperlipidemia: Secondary | ICD-10-CM | POA: Diagnosis not present

## 2015-01-31 DIAGNOSIS — E0800 Diabetes mellitus due to underlying condition with hyperosmolarity without nonketotic hyperglycemic-hyperosmolar coma (NKHHC): Secondary | ICD-10-CM | POA: Diagnosis not present

## 2015-03-21 DIAGNOSIS — E0800 Diabetes mellitus due to underlying condition with hyperosmolarity without nonketotic hyperglycemic-hyperosmolar coma (NKHHC): Secondary | ICD-10-CM | POA: Diagnosis not present

## 2015-03-21 DIAGNOSIS — N189 Chronic kidney disease, unspecified: Secondary | ICD-10-CM | POA: Diagnosis not present

## 2015-03-21 DIAGNOSIS — E782 Mixed hyperlipidemia: Secondary | ICD-10-CM | POA: Diagnosis not present

## 2015-03-21 DIAGNOSIS — I7389 Other specified peripheral vascular diseases: Secondary | ICD-10-CM | POA: Diagnosis not present

## 2015-08-09 ENCOUNTER — Emergency Department (HOSPITAL_COMMUNITY): Payer: Medicare Other

## 2015-08-09 ENCOUNTER — Encounter (HOSPITAL_COMMUNITY): Payer: Self-pay | Admitting: *Deleted

## 2015-08-09 ENCOUNTER — Emergency Department (HOSPITAL_COMMUNITY)
Admission: EM | Admit: 2015-08-09 | Discharge: 2015-08-09 | Disposition: A | Discharge status: Home / Self Care | Payer: Medicare Other | Attending: Physician Assistant | Admitting: Physician Assistant

## 2015-08-09 DIAGNOSIS — Z9842 Cataract extraction status, left eye: Secondary | ICD-10-CM | POA: Diagnosis not present

## 2015-08-09 DIAGNOSIS — Z79899 Other long term (current) drug therapy: Secondary | ICD-10-CM | POA: Insufficient documentation

## 2015-08-09 DIAGNOSIS — Z7982 Long term (current) use of aspirin: Secondary | ICD-10-CM | POA: Insufficient documentation

## 2015-08-09 DIAGNOSIS — S93402A Sprain of unspecified ligament of left ankle, initial encounter: Secondary | ICD-10-CM | POA: Diagnosis not present

## 2015-08-09 DIAGNOSIS — Z87891 Personal history of nicotine dependence: Secondary | ICD-10-CM | POA: Diagnosis not present

## 2015-08-09 DIAGNOSIS — I10 Essential (primary) hypertension: Secondary | ICD-10-CM | POA: Insufficient documentation

## 2015-08-09 DIAGNOSIS — S99922A Unspecified injury of left foot, initial encounter: Secondary | ICD-10-CM | POA: Diagnosis not present

## 2015-08-09 DIAGNOSIS — S99912A Unspecified injury of left ankle, initial encounter: Secondary | ICD-10-CM | POA: Diagnosis present

## 2015-08-09 DIAGNOSIS — Z9841 Cataract extraction status, right eye: Secondary | ICD-10-CM | POA: Diagnosis not present

## 2015-08-09 DIAGNOSIS — Y998 Other external cause status: Secondary | ICD-10-CM | POA: Insufficient documentation

## 2015-08-09 DIAGNOSIS — M25572 Pain in left ankle and joints of left foot: Secondary | ICD-10-CM | POA: Diagnosis not present

## 2015-08-09 DIAGNOSIS — W010XXA Fall on same level from slipping, tripping and stumbling without subsequent striking against object, initial encounter: Secondary | ICD-10-CM | POA: Insufficient documentation

## 2015-08-09 DIAGNOSIS — Y92096 Garden or yard of other non-institutional residence as the place of occurrence of the external cause: Secondary | ICD-10-CM | POA: Diagnosis not present

## 2015-08-09 DIAGNOSIS — E119 Type 2 diabetes mellitus without complications: Secondary | ICD-10-CM | POA: Insufficient documentation

## 2015-08-09 DIAGNOSIS — Y9389 Activity, other specified: Secondary | ICD-10-CM | POA: Insufficient documentation

## 2015-08-09 MED ORDER — HYDROCODONE-ACETAMINOPHEN 5-325 MG PO TABS: 1.0000 | ORAL_TABLET | Freq: Four times a day (QID) | ORAL | Status: DC | PRN

## 2015-08-09 NOTE — ED Provider Notes (Signed)
CSN: [id number]     Arrival date & time 08/09/15  [id number] History  By signing my name below, I, Essence Howell, attest that this documentation has been prepared under the direction and in the presence of Montine Circle, PA-C Electronically Signed: Ladene Artist, ED Scribe 08/09/2015 at 9:34 AM    Chief Complaint  Patient presents with  . Fall  . Foot Pain   The history is provided by the patient. No language interpreter was used.   HPI Comments: Kenneth Bennett is a 82 y.o. male who presents to the Emergency Department with a chief complaint of a fall that occurred 2 days ago. Pt states that he slipped in his yard and twisted his left ankle. He reports a constant, throbbing sensation that is exacerbated with palpation. Pt currently ambulates with a cane.    Past Medical History  Diagnosis Date  . Hypertension   . Cataracts, both eyes     Hx: of  . Diabetes mellitus without complication [facility])    Past Surgical History  Procedure Laterality Date  . Hernia repair    . Colonoscopy      Hx: of  . Cataract extraction w/phaco Left 11/16/2012    Procedure: CATARACT EXTRACTION PHACO AND INTRAOCULAR LENS PLACEMENT (IOC);  Surgeon: Adonis Brook, MD;  Location: [place];  Service: Ophthalmology;  Laterality: Left;   History reviewed. No pertinent family history. Social History  Substance Use Topics  . Smoking status: Former Smoker    Quit date: 12/02/1983  . Smokeless tobacco: Never Used  . Alcohol Use: 0.0 oz/week    0 Standard drinks or equivalent per week     Comment: occasional    Review of Systems  Musculoskeletal: Positive for arthralgias.   Allergies  Review of patient's allergies indicates no known allergies.  Home Medications   Prior to Admission medications   Medication Sig Start Date End Date Taking? Authorizing Provider  aspirin EC 81 MG tablet Take 81 mg by mouth daily.    Historical Provider, MD  methyldopa (ALDOMET) 250 MG tablet Take 500 mg by mouth daily with breakfast.      Historical Provider, MD  rosuvastatin (CRESTOR) 20 MG tablet Take 20 mg by mouth daily with breakfast.     Historical Provider, MD   BP 147/76 mmHg  Pulse 81  Temp(Src) 98.1 F (36.7 C) (Oral)  Resp 20 Physical Exam Physical Exam  Constitutional: Pt appears well-developed and well-nourished. No distress.  HENT:  Head: Normocephalic and atraumatic.  Eyes: Conjunctivae are normal.  Neck: Normal range of motion.  Cardiovascular: Normal rate, regular rhythm and intact distal pulses.   Capillary refill < 3 sec  Pulmonary/Chest: Effort normal and breath sounds normal.  Musculoskeletal: Pt exhibits tenderness over the lateral aspect of the L ankle, specifically over the ATFL. Pt exhibits no edema.  ROM: 5/5  Neurological: Pt  is alert. Coordination normal.  Sensation 5/5 Strength 5/5  Skin: Skin is warm and dry. Pt is not diaphoretic.  No tenting of the skin  Psychiatric: Pt has a normal mood and affect.  Nursing note and vitals reviewed.  ED Course  Procedures (including critical care time) DIAGNOSTIC STUDIES:   COORDINATION OF CARE: 9:24 AM-Discussed treatment plan which includes XR, Norco and ankle brace with pt at bedside and pt agreed to plan.    Imaging Review Dg Ankle Complete Left  08/09/2015  CLINICAL DATA:  Twisted ankle 2 days ago with lateral pain. EXAM: LEFT ANKLE COMPLETE - 3+ VIEW COMPARISON:  04/27/2010 FINDINGS: There is no evidence of fracture, dislocation, or joint effusion. There is no evidence of arthropathy or other focal bone abnormality. Soft tissues are unremarkable. IMPRESSION: Negative. Electronically Signed   By: Nelson Chimes M.D.   On: 08/09/2015 09:09   I have personally reviewed and evaluated these images and lab results as part of my medical decision-making.    MDM   Final diagnoses:  Ankle sprain, left, initial encounter    Left ankle sprain.  Patient is ambulatory.  No bony abnormality.  Tender over ATLF.  Recommend brace, rest, ortho  follow-up.  Neurovascularly intact.  I personally performed the services described in this documentation, which was scribed in my presence. The recorded information has been reviewed and is accurate.      Montine Circle, PA-C 08/09/15 [place], MD 08/10/15 [phone]

## 2015-08-09 NOTE — ED Notes (Signed)
Pt reports tripping and falling two days ago and twisted left ankle and having pain. No other complaints.

## 2015-08-09 NOTE — Discharge Instructions (Signed)

## 2015-08-28 DIAGNOSIS — L308 Other specified dermatitis: Secondary | ICD-10-CM | POA: Diagnosis not present

## 2015-08-28 DIAGNOSIS — M1000 Idiopathic gout, unspecified site: Secondary | ICD-10-CM | POA: Diagnosis not present

## 2015-08-28 DIAGNOSIS — I10 Essential (primary) hypertension: Secondary | ICD-10-CM | POA: Diagnosis not present

## 2015-08-28 DIAGNOSIS — N189 Chronic kidney disease, unspecified: Secondary | ICD-10-CM | POA: Diagnosis not present

## 2015-08-28 DIAGNOSIS — E782 Mixed hyperlipidemia: Secondary | ICD-10-CM | POA: Diagnosis not present

## 2015-08-28 DIAGNOSIS — E0811 Diabetes mellitus due to underlying condition with ketoacidosis with coma: Secondary | ICD-10-CM | POA: Diagnosis not present

## 2015-11-27 DIAGNOSIS — E000 Congenital iodine-deficiency syndrome, neurological type: Secondary | ICD-10-CM | POA: Diagnosis not present

## 2015-11-27 DIAGNOSIS — Z6826 Body mass index (BMI) 26.0-26.9, adult: Secondary | ICD-10-CM | POA: Diagnosis not present

## 2015-11-27 DIAGNOSIS — E0800 Diabetes mellitus due to underlying condition with hyperosmolarity without nonketotic hyperglycemic-hyperosmolar coma (NKHHC): Secondary | ICD-10-CM | POA: Diagnosis not present

## 2015-11-27 DIAGNOSIS — I10 Essential (primary) hypertension: Secondary | ICD-10-CM | POA: Diagnosis not present

## 2015-11-27 DIAGNOSIS — N189 Chronic kidney disease, unspecified: Secondary | ICD-10-CM | POA: Diagnosis not present

## 2015-11-27 DIAGNOSIS — I7389 Other specified peripheral vascular diseases: Secondary | ICD-10-CM | POA: Diagnosis not present

## 2015-11-27 DIAGNOSIS — M130 Polyarthritis, unspecified: Secondary | ICD-10-CM | POA: Diagnosis not present

## 2015-11-27 DIAGNOSIS — M109 Gout, unspecified: Secondary | ICD-10-CM | POA: Diagnosis not present

## 2016-02-25 DIAGNOSIS — M1000 Idiopathic gout, unspecified site: Secondary | ICD-10-CM | POA: Diagnosis not present

## 2016-02-25 DIAGNOSIS — I7389 Other specified peripheral vascular diseases: Secondary | ICD-10-CM | POA: Diagnosis not present

## 2016-02-25 DIAGNOSIS — I10 Essential (primary) hypertension: Secondary | ICD-10-CM | POA: Diagnosis not present

## 2016-02-25 DIAGNOSIS — E089 Diabetes mellitus due to underlying condition without complications: Secondary | ICD-10-CM | POA: Diagnosis not present

## 2016-02-25 DIAGNOSIS — E782 Mixed hyperlipidemia: Secondary | ICD-10-CM | POA: Diagnosis not present

## 2016-02-25 DIAGNOSIS — N189 Chronic kidney disease, unspecified: Secondary | ICD-10-CM | POA: Diagnosis not present

## 2016-02-25 DIAGNOSIS — M130 Polyarthritis, unspecified: Secondary | ICD-10-CM | POA: Diagnosis not present

## 2016-03-18 DIAGNOSIS — H40033 Anatomical narrow angle, bilateral: Secondary | ICD-10-CM | POA: Diagnosis not present

## 2016-03-18 DIAGNOSIS — H2513 Age-related nuclear cataract, bilateral: Secondary | ICD-10-CM | POA: Diagnosis not present

## 2016-05-26 DIAGNOSIS — I7389 Other specified peripheral vascular diseases: Secondary | ICD-10-CM | POA: Diagnosis not present

## 2016-05-26 DIAGNOSIS — E782 Mixed hyperlipidemia: Secondary | ICD-10-CM | POA: Diagnosis not present

## 2016-05-26 DIAGNOSIS — M130 Polyarthritis, unspecified: Secondary | ICD-10-CM | POA: Diagnosis not present

## 2016-05-26 DIAGNOSIS — E0800 Diabetes mellitus due to underlying condition with hyperosmolarity without nonketotic hyperglycemic-hyperosmolar coma (NKHHC): Secondary | ICD-10-CM | POA: Diagnosis not present

## 2016-05-26 DIAGNOSIS — N189 Chronic kidney disease, unspecified: Secondary | ICD-10-CM | POA: Diagnosis not present

## 2016-09-23 DIAGNOSIS — E782 Mixed hyperlipidemia: Secondary | ICD-10-CM | POA: Diagnosis not present

## 2016-09-23 DIAGNOSIS — N189 Chronic kidney disease, unspecified: Secondary | ICD-10-CM | POA: Diagnosis not present

## 2016-09-23 DIAGNOSIS — E0800 Diabetes mellitus due to underlying condition with hyperosmolarity without nonketotic hyperglycemic-hyperosmolar coma (NKHHC): Secondary | ICD-10-CM | POA: Diagnosis not present

## 2016-09-23 DIAGNOSIS — M130 Polyarthritis, unspecified: Secondary | ICD-10-CM | POA: Diagnosis not present

## 2016-09-23 DIAGNOSIS — I10 Essential (primary) hypertension: Secondary | ICD-10-CM | POA: Diagnosis not present

## 2017-01-21 DIAGNOSIS — I10 Essential (primary) hypertension: Secondary | ICD-10-CM | POA: Diagnosis not present

## 2017-01-21 DIAGNOSIS — E782 Mixed hyperlipidemia: Secondary | ICD-10-CM | POA: Diagnosis not present

## 2017-01-21 DIAGNOSIS — M1000 Idiopathic gout, unspecified site: Secondary | ICD-10-CM | POA: Diagnosis not present

## 2017-01-21 DIAGNOSIS — E0800 Diabetes mellitus due to underlying condition with hyperosmolarity without nonketotic hyperglycemic-hyperosmolar coma (NKHHC): Secondary | ICD-10-CM | POA: Diagnosis not present

## 2017-01-21 DIAGNOSIS — M130 Polyarthritis, unspecified: Secondary | ICD-10-CM | POA: Diagnosis not present

## 2017-01-21 DIAGNOSIS — N189 Chronic kidney disease, unspecified: Secondary | ICD-10-CM | POA: Diagnosis not present

## 2017-02-23 ENCOUNTER — Other Ambulatory Visit: Payer: Self-pay

## 2017-02-23 DIAGNOSIS — N189 Chronic kidney disease, unspecified: Secondary | ICD-10-CM | POA: Diagnosis not present

## 2017-02-23 DIAGNOSIS — I10 Essential (primary) hypertension: Secondary | ICD-10-CM | POA: Diagnosis not present

## 2017-02-23 DIAGNOSIS — M130 Polyarthritis, unspecified: Secondary | ICD-10-CM | POA: Diagnosis not present

## 2017-02-23 DIAGNOSIS — E782 Mixed hyperlipidemia: Secondary | ICD-10-CM | POA: Diagnosis not present

## 2017-03-18 DIAGNOSIS — H04123 Dry eye syndrome of bilateral lacrimal glands: Secondary | ICD-10-CM | POA: Diagnosis not present

## 2017-03-18 DIAGNOSIS — H40033 Anatomical narrow angle, bilateral: Secondary | ICD-10-CM | POA: Diagnosis not present

## 2017-04-27 DIAGNOSIS — Z6825 Body mass index (BMI) 25.0-25.9, adult: Secondary | ICD-10-CM | POA: Diagnosis not present

## 2017-04-27 DIAGNOSIS — E0800 Diabetes mellitus due to underlying condition with hyperosmolarity without nonketotic hyperglycemic-hyperosmolar coma (NKHHC): Secondary | ICD-10-CM | POA: Diagnosis not present

## 2017-04-27 DIAGNOSIS — N189 Chronic kidney disease, unspecified: Secondary | ICD-10-CM | POA: Diagnosis not present

## 2017-04-27 DIAGNOSIS — I10 Essential (primary) hypertension: Secondary | ICD-10-CM | POA: Diagnosis not present

## 2017-04-27 DIAGNOSIS — M130 Polyarthritis, unspecified: Secondary | ICD-10-CM | POA: Diagnosis not present

## 2017-05-19 DIAGNOSIS — I10 Essential (primary) hypertension: Secondary | ICD-10-CM | POA: Diagnosis not present

## 2017-05-19 DIAGNOSIS — E782 Mixed hyperlipidemia: Secondary | ICD-10-CM | POA: Diagnosis not present

## 2017-05-19 DIAGNOSIS — N189 Chronic kidney disease, unspecified: Secondary | ICD-10-CM | POA: Diagnosis not present

## 2017-07-10 DIAGNOSIS — H2513 Age-related nuclear cataract, bilateral: Secondary | ICD-10-CM | POA: Diagnosis not present

## 2017-10-09 DIAGNOSIS — Z125 Encounter for screening for malignant neoplasm of prostate: Secondary | ICD-10-CM | POA: Diagnosis not present

## 2017-10-09 DIAGNOSIS — Z1331 Encounter for screening for depression: Secondary | ICD-10-CM | POA: Diagnosis not present

## 2017-10-09 DIAGNOSIS — I10 Essential (primary) hypertension: Secondary | ICD-10-CM | POA: Diagnosis not present

## 2017-10-09 DIAGNOSIS — R42 Dizziness and giddiness: Secondary | ICD-10-CM | POA: Diagnosis not present

## 2017-10-09 DIAGNOSIS — Z1339 Encounter for screening examination for other mental health and behavioral disorders: Secondary | ICD-10-CM | POA: Diagnosis not present

## 2017-10-09 DIAGNOSIS — Z0000 Encounter for general adult medical examination without abnormal findings: Secondary | ICD-10-CM | POA: Diagnosis not present

## 2017-10-09 DIAGNOSIS — Z79899 Other long term (current) drug therapy: Secondary | ICD-10-CM | POA: Diagnosis not present

## 2017-10-09 DIAGNOSIS — E7800 Pure hypercholesterolemia, unspecified: Secondary | ICD-10-CM | POA: Diagnosis not present

## 2017-10-18 DIAGNOSIS — R42 Dizziness and giddiness: Secondary | ICD-10-CM | POA: Diagnosis not present

## 2017-10-22 DIAGNOSIS — I10 Essential (primary) hypertension: Secondary | ICD-10-CM | POA: Diagnosis not present

## 2017-10-22 DIAGNOSIS — R42 Dizziness and giddiness: Secondary | ICD-10-CM | POA: Diagnosis not present

## 2017-10-22 DIAGNOSIS — E119 Type 2 diabetes mellitus without complications: Secondary | ICD-10-CM | POA: Diagnosis not present

## 2017-10-22 DIAGNOSIS — E7800 Pure hypercholesterolemia, unspecified: Secondary | ICD-10-CM | POA: Diagnosis not present

## 2017-10-29 DIAGNOSIS — E119 Type 2 diabetes mellitus without complications: Secondary | ICD-10-CM | POA: Diagnosis not present

## 2017-10-29 DIAGNOSIS — I10 Essential (primary) hypertension: Secondary | ICD-10-CM | POA: Diagnosis not present

## 2017-10-29 DIAGNOSIS — E7800 Pure hypercholesterolemia, unspecified: Secondary | ICD-10-CM | POA: Diagnosis not present

## 2017-11-03 DIAGNOSIS — R9389 Abnormal findings on diagnostic imaging of other specified body structures: Secondary | ICD-10-CM | POA: Diagnosis not present

## 2017-11-03 DIAGNOSIS — E119 Type 2 diabetes mellitus without complications: Secondary | ICD-10-CM | POA: Diagnosis not present

## 2017-12-20 DIAGNOSIS — Z0000 Encounter for general adult medical examination without abnormal findings: Secondary | ICD-10-CM | POA: Diagnosis not present

## 2019-04-14 ENCOUNTER — Other Ambulatory Visit: Payer: Self-pay

## 2019-04-14 ENCOUNTER — Inpatient Hospital Stay (HOSPITAL_COMMUNITY)
Admission: EM | Admit: 2019-04-14 | Discharge: 2019-04-19 | DRG: 064 | Disposition: A | Discharge status: Skilled Nursing Facility | Payer: Medicare Other | Attending: Internal Medicine | Admitting: Internal Medicine

## 2019-04-14 ENCOUNTER — Encounter (HOSPITAL_COMMUNITY): Payer: Self-pay | Admitting: *Deleted

## 2019-04-14 DIAGNOSIS — N184 Chronic kidney disease, stage 4 (severe): Secondary | ICD-10-CM | POA: Diagnosis present

## 2019-04-14 DIAGNOSIS — I639 Cerebral infarction, unspecified: Secondary | ICD-10-CM

## 2019-04-14 DIAGNOSIS — I10 Essential (primary) hypertension: Secondary | ICD-10-CM | POA: Diagnosis present

## 2019-04-14 DIAGNOSIS — R29701 NIHSS score 1: Secondary | ICD-10-CM | POA: Diagnosis present

## 2019-04-14 DIAGNOSIS — Z9114 Patient's other noncompliance with medication regimen: Secondary | ICD-10-CM

## 2019-04-14 DIAGNOSIS — E1136 Type 2 diabetes mellitus with diabetic cataract: Secondary | ICD-10-CM | POA: Diagnosis present

## 2019-04-14 DIAGNOSIS — E1149 Type 2 diabetes mellitus with other diabetic neurological complication: Secondary | ICD-10-CM | POA: Diagnosis present

## 2019-04-14 DIAGNOSIS — I129 Hypertensive chronic kidney disease with stage 1 through stage 4 chronic kidney disease, or unspecified chronic kidney disease: Secondary | ICD-10-CM | POA: Diagnosis present

## 2019-04-14 DIAGNOSIS — E1122 Type 2 diabetes mellitus with diabetic chronic kidney disease: Secondary | ICD-10-CM | POA: Diagnosis present

## 2019-04-14 DIAGNOSIS — R4189 Other symptoms and signs involving cognitive functions and awareness: Secondary | ICD-10-CM | POA: Diagnosis present

## 2019-04-14 DIAGNOSIS — G9341 Metabolic encephalopathy: Secondary | ICD-10-CM | POA: Diagnosis not present

## 2019-04-14 DIAGNOSIS — I63233 Cerebral infarction due to unspecified occlusion or stenosis of bilateral carotid arteries: Principal | ICD-10-CM | POA: Diagnosis present

## 2019-04-14 DIAGNOSIS — H532 Diplopia: Secondary | ICD-10-CM | POA: Diagnosis present

## 2019-04-14 DIAGNOSIS — Z87891 Personal history of nicotine dependence: Secondary | ICD-10-CM

## 2019-04-14 DIAGNOSIS — Z20828 Contact with and (suspected) exposure to other viral communicable diseases: Secondary | ICD-10-CM | POA: Diagnosis present

## 2019-04-14 DIAGNOSIS — E785 Hyperlipidemia, unspecified: Secondary | ICD-10-CM | POA: Diagnosis present

## 2019-04-14 DIAGNOSIS — Z7982 Long term (current) use of aspirin: Secondary | ICD-10-CM

## 2019-04-14 HISTORY — DX: Cerebral infarction, unspecified: I63.9

## 2019-04-14 LAB — COMPREHENSIVE METABOLIC PANEL
ALT: 10 U/L (ref 0–44)
AST: 17 U/L (ref 15–41)
Albumin: 3.7 g/dL (ref 3.5–5.0)
Alkaline Phosphatase: 88 U/L (ref 38–126)
Anion gap: 10 (ref 5–15)
BUN: 36 mg/dL — ABNORMAL HIGH (ref 8–23)
CO2: 19 mmol/L — ABNORMAL LOW (ref 22–32)
Calcium: 9.4 mg/dL (ref 8.9–10.3)
Chloride: 110 mmol/L (ref 98–111)
Creatinine, Ser: 2.48 mg/dL — ABNORMAL HIGH (ref 0.61–1.24)
GFR calc Af Amer: 27 mL/min — ABNORMAL LOW (ref >60)
GFR calc non Af Amer: 23 mL/min — ABNORMAL LOW (ref >60)
Glucose, Bld: 134 mg/dL — ABNORMAL HIGH (ref 70–99)
Potassium: 4.9 mmol/L (ref 3.5–5.1)
Sodium: 139 mmol/L (ref 135–145)
Total Bilirubin: 0.4 mg/dL (ref 0.3–1.2)
Total Protein: 7.5 g/dL (ref 6.5–8.1)

## 2019-04-14 LAB — URINALYSIS, ROUTINE W REFLEX MICROSCOPIC
Bacteria, UA: NONE SEEN
Bilirubin Urine: NEGATIVE
Glucose, UA: NEGATIVE mg/dL
Hgb urine dipstick: NEGATIVE
Ketones, ur: NEGATIVE mg/dL
Nitrite: NEGATIVE
Protein, ur: NEGATIVE mg/dL
Specific Gravity, Urine: 1.018 (ref 1.005–1.030)
pH: 5 (ref 5.0–8.0)

## 2019-04-14 LAB — CBC
HCT: 34.8 % — ABNORMAL LOW (ref 39.0–52.0)
Hemoglobin: 11.1 g/dL — ABNORMAL LOW (ref 13.0–17.0)
MCH: 28.8 pg (ref 26.0–34.0)
MCHC: 31.9 g/dL (ref 30.0–36.0)
MCV: 90.4 fL (ref 80.0–100.0)
Platelets: 224 10*3/uL (ref 150–400)
RBC: 3.85 MIL/uL — ABNORMAL LOW (ref 4.22–5.81)
RDW: 16.3 % — ABNORMAL HIGH (ref 11.5–15.5)
WBC: 6.6 10*3/uL (ref 4.0–10.5)
nRBC: 0 % (ref 0.0–0.2)

## 2019-04-14 LAB — LIPASE, BLOOD: Lipase: 31 U/L (ref 11–51)

## 2019-04-14 MED ORDER — SODIUM CHLORIDE 0.9% FLUSH: 3.0000 mL | Freq: Once | INTRAVENOUS | Status: DC

## 2019-04-14 NOTE — ED Triage Notes (Signed)
The pt is c/o dizziness for 3-4 days no nausea  Hx of the same no pain

## 2019-04-15 ENCOUNTER — Inpatient Hospital Stay (HOSPITAL_COMMUNITY): Payer: Medicare Other

## 2019-04-15 ENCOUNTER — Emergency Department (HOSPITAL_COMMUNITY): Payer: Medicare Other

## 2019-04-15 ENCOUNTER — Other Ambulatory Visit: Payer: Self-pay

## 2019-04-15 ENCOUNTER — Encounter (HOSPITAL_COMMUNITY): Payer: Self-pay | Admitting: Internal Medicine

## 2019-04-15 DIAGNOSIS — Z9114 Patient's other noncompliance with medication regimen: Secondary | ICD-10-CM | POA: Diagnosis not present

## 2019-04-15 DIAGNOSIS — I639 Cerebral infarction, unspecified: Secondary | ICD-10-CM | POA: Diagnosis present

## 2019-04-15 DIAGNOSIS — E785 Hyperlipidemia, unspecified: Secondary | ICD-10-CM | POA: Diagnosis present

## 2019-04-15 DIAGNOSIS — G9341 Metabolic encephalopathy: Secondary | ICD-10-CM | POA: Diagnosis not present

## 2019-04-15 DIAGNOSIS — Z20828 Contact with and (suspected) exposure to other viral communicable diseases: Secondary | ICD-10-CM | POA: Diagnosis present

## 2019-04-15 DIAGNOSIS — N184 Chronic kidney disease, stage 4 (severe): Secondary | ICD-10-CM

## 2019-04-15 DIAGNOSIS — I10 Essential (primary) hypertension: Secondary | ICD-10-CM | POA: Diagnosis present

## 2019-04-15 DIAGNOSIS — I340 Nonrheumatic mitral (valve) insufficiency: Secondary | ICD-10-CM | POA: Diagnosis not present

## 2019-04-15 DIAGNOSIS — I129 Hypertensive chronic kidney disease with stage 1 through stage 4 chronic kidney disease, or unspecified chronic kidney disease: Secondary | ICD-10-CM | POA: Diagnosis present

## 2019-04-15 DIAGNOSIS — E1149 Type 2 diabetes mellitus with other diabetic neurological complication: Secondary | ICD-10-CM | POA: Diagnosis present

## 2019-04-15 DIAGNOSIS — E1136 Type 2 diabetes mellitus with diabetic cataract: Secondary | ICD-10-CM | POA: Diagnosis present

## 2019-04-15 DIAGNOSIS — I63233 Cerebral infarction due to unspecified occlusion or stenosis of bilateral carotid arteries: Secondary | ICD-10-CM | POA: Diagnosis present

## 2019-04-15 DIAGNOSIS — Z8673 Personal history of transient ischemic attack (TIA), and cerebral infarction without residual deficits: Secondary | ICD-10-CM | POA: Insufficient documentation

## 2019-04-15 DIAGNOSIS — E1122 Type 2 diabetes mellitus with diabetic chronic kidney disease: Secondary | ICD-10-CM | POA: Diagnosis present

## 2019-04-15 DIAGNOSIS — R4189 Other symptoms and signs involving cognitive functions and awareness: Secondary | ICD-10-CM | POA: Diagnosis present

## 2019-04-15 DIAGNOSIS — R29701 NIHSS score 1: Secondary | ICD-10-CM | POA: Diagnosis present

## 2019-04-15 DIAGNOSIS — H532 Diplopia: Secondary | ICD-10-CM | POA: Diagnosis present

## 2019-04-15 DIAGNOSIS — Z87891 Personal history of nicotine dependence: Secondary | ICD-10-CM | POA: Diagnosis not present

## 2019-04-15 DIAGNOSIS — Z7982 Long term (current) use of aspirin: Secondary | ICD-10-CM | POA: Diagnosis not present

## 2019-04-15 HISTORY — DX: Chronic kidney disease, stage 4 (severe): N18.4

## 2019-04-15 LAB — GLUCOSE, CAPILLARY: Glucose-Capillary: 105 mg/dL — ABNORMAL HIGH (ref 70–99)

## 2019-04-15 LAB — TSH: TSH: 2.569 u[IU]/mL (ref 0.350–4.500)

## 2019-04-15 LAB — RAPID URINE DRUG SCREEN, HOSP PERFORMED
Amphetamines: NOT DETECTED
Barbiturates: NOT DETECTED
Benzodiazepines: NOT DETECTED
Cocaine: NOT DETECTED
Opiates: NOT DETECTED
Tetrahydrocannabinol: NOT DETECTED

## 2019-04-15 LAB — SARS CORONAVIRUS 2 (TAT 6-24 HRS): SARS Coronavirus 2: NEGATIVE

## 2019-04-15 MED ORDER — INSULIN ASPART 100 UNIT/ML ~~LOC~~ SOLN: 0.0000 [IU] | Freq: Three times a day (TID) | SUBCUTANEOUS | Status: DC

## 2019-04-15 MED ORDER — SENNOSIDES-DOCUSATE SODIUM 8.6-50 MG PO TABS
1.0000 | ORAL_TABLET | Freq: Every evening | ORAL | Status: DC | PRN
  Filled 2019-04-15: qty 1

## 2019-04-15 MED ORDER — SODIUM CHLORIDE 0.9 % IV SOLN
INTRAVENOUS | Status: DC
  Administered 2019-04-15 – 2019-04-17 (×3): via INTRAVENOUS

## 2019-04-15 MED ORDER — ATORVASTATIN CALCIUM 40 MG PO TABS
40.0000 mg | ORAL_TABLET | Freq: Every day | ORAL | Status: DC
  Administered 2019-04-15 – 2019-04-18 (×4): 40 mg via ORAL
  Filled 2019-04-15 (×4): qty 1

## 2019-04-15 MED ORDER — ACETAMINOPHEN 325 MG PO TABS: 650.0000 mg | ORAL_TABLET | ORAL | Status: DC | PRN

## 2019-04-15 MED ORDER — ACETAMINOPHEN 160 MG/5ML PO SOLN: 650.0000 mg | ORAL | Status: DC | PRN

## 2019-04-15 MED ORDER — ASPIRIN 300 MG RE SUPP: 300.0000 mg | Freq: Every day | RECTAL | Status: DC

## 2019-04-15 MED ORDER — STROKE: EARLY STAGES OF RECOVERY BOOK
Freq: Once | Status: AC
  Administered 2019-04-15: 19:00:00
  Filled 2019-04-15: qty 1

## 2019-04-15 MED ORDER — ENOXAPARIN SODIUM 40 MG/0.4ML ~~LOC~~ SOLN
40.0000 mg | SUBCUTANEOUS | Status: DC
  Administered 2019-04-15: 40 mg via SUBCUTANEOUS
  Filled 2019-04-15: qty 0.4

## 2019-04-15 MED ORDER — ACETAMINOPHEN 650 MG RE SUPP: 650.0000 mg | RECTAL | Status: DC | PRN

## 2019-04-15 MED ORDER — ASPIRIN 325 MG PO TABS
325.0000 mg | ORAL_TABLET | Freq: Every day | ORAL | Status: DC
  Administered 2019-04-15 – 2019-04-16 (×2): 325 mg via ORAL
  Filled 2019-04-15 (×2): qty 1

## 2019-04-15 NOTE — ED Notes (Signed)
Pt stated dizziness resolved while in waiting room.

## 2019-04-15 NOTE — ED Notes (Signed)
ED TO INPATIENT HANDOFF REPORT  ED Nurse Name and Phone #:   S Name/Age/Gender Kenneth Bennett 85 y.o. male Room/Bed: RESUSC/RESUSC  Code Status   Code Status: Not on file  Home/SNF/Other Dc home AO x 4    Triage Complete: Triage complete  Chief Complaint Dizzy  Triage Note The pt is c/o dizziness for 3-4 days no nausea  Hx of the same no pain   Allergies No Known Allergies  Level of Care/Admitting Diagnosis ED Disposition    None      B Medical/Surgery History Past Medical History:  Diagnosis Date  . Cataracts, both eyes    Hx: of  . Diabetes mellitus without complication ([place])   . Hypertension    Past Surgical History:  Procedure Laterality Date  . CATARACT EXTRACTION W/PHACO Left 11/16/2012   Procedure: CATARACT EXTRACTION PHACO AND INTRAOCULAR LENS PLACEMENT (IOC);  Surgeon: Adonis Brook, MD;  Location: [place];  Service: Ophthalmology;  Laterality: Left;  . COLONOSCOPY     Hx: of  . HERNIA REPAIR       A IV Location/Drains/Wounds Patient Lines/Drains/Airways Status   Active Line/Drains/Airways    Name:   Placement date:   Placement time:   Site:   Days:   Peripheral IV 04/15/19 Right;Posterior Wrist   04/15/19    0609    Wrist   less than 1   Incision 11/16/12 Eye Left   11/16/12    1428     2341          Intake/Output Last 24 hours No intake or output data in the 24 hours ending 04/15/19 [id number]  Labs/Imaging Results for orders placed or performed during the hospital encounter of 04/14/19 (from the past 48 hour(s))  Lipase, blood     Status: None   Collection Time: 04/14/19  5:59 PM  Result Value Ref Range   Lipase 31 11 - 51 U/L    Comment: Performed at [facility], [place] [address] [place], [place] [address]  Comprehensive metabolic panel     Status: Abnormal   Collection Time: 04/14/19  5:59 PM  Result Value Ref Range   Sodium 139 135 - 145 mmol/L   Potassium 4.9 3.5 - 5.1 mmol/L   Chloride 110 98 - 111 mmol/L   CO2 19 (L) 22 -  32 mmol/L   Glucose, Bld 134 (H) 70 - 99 mg/dL   BUN 36 (H) 8 - 23 mg/dL   Creatinine, Ser 2.48 (H) 0.61 - 1.24 mg/dL   Calcium 9.4 8.9 - 10.3 mg/dL   Total Protein 7.5 6.5 - 8.1 g/dL   Albumin 3.7 3.5 - 5.0 g/dL   AST 17 15 - 41 U/L   ALT 10 0 - 44 U/L   Alkaline Phosphatase 88 38 - 126 U/L   Total Bilirubin 0.4 0.3 - 1.2 mg/dL   GFR calc non Af Amer 23 (L) >60 mL/min   GFR calc Af Amer 27 (L) >60 mL/min   Anion gap 10 5 - 15    Comment: Performed at [facility], [place] [address] [place], [place] [address]  CBC     Status: Abnormal   Collection Time: 04/14/19  5:59 PM  Result Value Ref Range   WBC 6.6 4.0 - 10.5 K/uL   RBC 3.85 (L) 4.22 - 5.81 MIL/uL   Hemoglobin 11.1 (L) 13.0 - 17.0 g/dL   HCT 34.8 (L) 39.0 - 52.0 %   MCV 90.4 80.0 - 100.0  fL   MCH 28.8 26.0 - 34.0 pg   MCHC 31.9 30.0 - 36.0 g/dL   RDW 16.3 (H) 11.5 - 15.5 %   Platelets 224 150 - 400 K/uL   nRBC 0.0 0.0 - 0.2 %    Comment: Performed at [facility] [address], [place], [place] [address]  Urinalysis, Routine w reflex microscopic     Status: Abnormal   Collection Time: 04/14/19 10:15 PM  Result Value Ref Range   Color, Urine YELLOW YELLOW   APPearance CLEAR CLEAR   Specific Gravity, Urine 1.018 1.005 - 1.030   pH 5.0 5.0 - 8.0   Glucose, UA NEGATIVE NEGATIVE mg/dL   Hgb urine dipstick NEGATIVE NEGATIVE   Bilirubin Urine NEGATIVE NEGATIVE   Ketones, ur NEGATIVE NEGATIVE mg/dL   Protein, ur NEGATIVE NEGATIVE mg/dL   Nitrite NEGATIVE NEGATIVE   Leukocytes,Ua TRACE (A) NEGATIVE   RBC / HPF 0-5 0 - 5 RBC/hpf   WBC, UA 0-5 0 - 5 WBC/hpf   Bacteria, UA NONE SEEN NONE SEEN   Squamous Epithelial / LPF 0-5 0 - 5   Mucus PRESENT     Comment: Performed at [facility], [place] [address], [place], [place] [address]   Mr Brain Wo Contrast  Result Date: 04/15/2019 CLINICAL DATA:  Dizziness for 3-4 days. EXAM: MRI HEAD WITHOUT CONTRAST TECHNIQUE: Multiplanar, multiecho pulse sequences of the  brain and surrounding structures were obtained without intravenous contrast. COMPARISON:  None. FINDINGS: BRAIN: There are multiple small acute infarcts of the right cerebellar hemisphere. No acute ischemia elsewhere in the brain. Multifocal white matter hyperintensity, most commonly due to chronic ischemic microangiopathy. There is generalized atrophy without lobar predilection. The midline structures are normal. There is an old left cerebellar infarct. VASCULAR: The major intracranial arterial and venous sinus flow voids are normal. Single focus of chronic microhemorrhage in the left hemisphere no acute hemorrhage. SKULL AND UPPER CERVICAL SPINE: Calvarial bone marrow signal is normal. There is no skull base mass. The visualized upper cervical spine and soft tissues are normal. SINUSES/ORBITS: There are no fluid levels or advanced mucosal thickening. The mastoid air cells and middle ear cavities are free of fluid. The orbits are normal. IMPRESSION: 1. 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 2. No hemorrhage or mass effect. 3. Atrophy and chronic microvascular ischemia. Electronically Signed   By: Ulyses Jarred M.D.   On: 04/15/2019 04:48    Pending Labs Unresulted Labs (From admission, onward)    Start     Ordered   04/15/19 0539  SARS CORONAVIRUS 2 (TAT 6-24 HRS) Nasopharyngeal Nasopharyngeal Swab  (Asymptomatic/Tier 2)  Once,   STAT    Question Answer Comment  Is this test for diagnosis or screening Screening   Symptomatic for COVID-19 as defined by CDC No   Hospitalized for COVID-19 No   Admitted to ICU for COVID-19 No   Previously tested for COVID-19 No   Resident in a congregate (group) care setting No   Employed in healthcare setting No      04/15/19 0538          Vitals/Pain Today's Vitals   04/15/19 0230 04/15/19 0245 04/15/19 0330 04/15/19 0545  BP: (!) 172/91 (!) 165/85 (!) 188/85 (!) 181/88  Pulse:   64 89  Resp: 14 15 20 20   Temp:      TempSrc:      SpO2:    100% 96%  Weight:  Height:      PainSc:        Isolation Precautions No active isolations  Medications Medications  sodium chloride flush (NS) 0.9 % injection 3 mL (3 mLs Intravenous Not Given 04/15/19 0108)    Mobility One assist Low fall risk   Focused Assessments Pt AO x 4, NIH=0, pass the Swallow screen, SR to ST on the monitor.   R Recommendations: See Admitting Provider Note  Report given to:   Additional Notes:

## 2019-04-15 NOTE — Consult Note (Signed)
Referring Physician: Dr. Stark Jock    Chief Complaint: Dizziness with unsteady gait  HPI: Kenneth Bennett is an 85 y.o. male with DM, cataracts and HTN who presented to the [facility] ED in the early evening on Friday with a c/c of dizziness for 3-4 days. He describes the dizziness as not being a spinning sensation, but rather a "heavy head" sensation. He also endorses new onset of unsteadiness while ambulating. He denies arm incoordination. He did have some double vision, which has since resolved. He denies trouble with speech.   MRI obtained in the ED reveals small acute infarctions within the right cerebellar hemisphere.   He has no prior history of stroke or MI. He does not take ASA and is not on a blood thinner. He was on ASA at some point but "my doctor took me off of it".   He states that he has a "red eye" on the left due to an object striking his eye while mowing his lawn about one week ago.   LSN: 3-4 days prior to admission tPA Given: No: Out of time window  Past Medical History:  Diagnosis Date  . Cataracts, both eyes    Hx: of  . Diabetes mellitus without complication ([place])   . Hypertension     Past Surgical History:  Procedure Laterality Date  . CATARACT EXTRACTION W/PHACO Left 11/16/2012   Procedure: CATARACT EXTRACTION PHACO AND INTRAOCULAR LENS PLACEMENT (IOC);  Surgeon: Adonis Brook, MD;  Location: [place];  Service: Ophthalmology;  Laterality: Left;  . COLONOSCOPY     Hx: of  . HERNIA REPAIR      No family history on file. Social History:  reports that he quit smoking about 35 years ago. He has never used smokeless tobacco. He reports current alcohol use. He reports that he does not use drugs.  Allergies: No Known Allergies  Home Medications:  Hydrocodone Norco  ROS: As per HPI. Denies additional symptoms on comprehensive ROS.   Physical Examination: Blood pressure (!) 188/85, pulse 64, temperature (!) 97.4 F (36.3 C), temperature source Oral, resp. rate 20, height  5\' 6"  (1.676 m), weight 87.5 kg, SpO2 100 %.  HEENT: [place]/AT. Scleral injection on the left with patchy corneal opacification.  Lungs: Respirations unlabored Ext: No edema  Neurologic Examination: Mental Status: Alert, oriented to situation, city, state, year and day, but not month; there is significant delay in recalling some of the items. Speech fluent without evidence of aphasia.  Able to follow all commands without difficulty. Cranial Nerves: II:  Tracks and fixates normally. PERRL.  III,IV, VI: No ptosis. EOMI without nystagmus.  V,VII: No facial droop. Facial temp sensation equal bilaterally VIII: hearing intact to voice IX,X: Phonation intact XI: Head is midline XII: midline tongue extension  Motor: Right : Upper extremity   5/5    Left:     Upper extremity   5/5  Lower extremity   5/5     Lower extremity   5/5 Normal tone throughout; no atrophy noted Sensory: Temp and light touch intact x 4. No extinction Deep Tendon Reflexes:  2+ BUE, with somewhat less brisk right-sided reflexes 2+ left patella 4+ right patella (crossed adductor) 0 achilles bilaterally  Toes mute bilaterally.  Cerebellar: No ataxia with FNF bilaterally; action tremor on the left is noted. No dysdiadochokinesia with RAM bilaterally. H-S normal on the right with mild ataxia on the LEFT.  Gait: Deferred  Results for orders placed or performed during the hospital encounter of 04/14/19 (  from the past 48 hour(s))  Lipase, blood     Status: None   Collection Time: 04/14/19  5:59 PM  Result Value Ref Range   Lipase 31 11 - 51 U/L    Comment: Performed at [facility], [address] [place], [place] [address]  Comprehensive metabolic panel     Status: Abnormal   Collection Time: 04/14/19  5:59 PM  Result Value Ref Range   Sodium 139 135 - 145 mmol/L   Potassium 4.9 3.5 - 5.1 mmol/L   Chloride 110 98 - 111 mmol/L   CO2 19 (L) 22 - 32 mmol/L   Glucose, Bld 134 (H) 70 - 99 mg/dL   BUN 36 (H) 8 - 23  mg/dL   Creatinine, Ser 2.48 (H) 0.61 - 1.24 mg/dL   Calcium 9.4 8.9 - 10.3 mg/dL   Total Protein 7.5 6.5 - 8.1 g/dL   Albumin 3.7 3.5 - 5.0 g/dL   AST 17 15 - 41 U/L   ALT 10 0 - 44 U/L   Alkaline Phosphatase 88 38 - 126 U/L   Total Bilirubin 0.4 0.3 - 1.2 mg/dL   GFR calc non Af Amer 23 (L) >60 mL/min   GFR calc Af Amer 27 (L) >60 mL/min   Anion gap 10 5 - 15    Comment: Performed at [facility], [place] [address] [place], [place] [address]  CBC     Status: Abnormal   Collection Time: 04/14/19  5:59 PM  Result Value Ref Range   WBC 6.6 4.0 - 10.5 K/uL   RBC 3.85 (L) 4.22 - 5.81 MIL/uL   Hemoglobin 11.1 (L) 13.0 - 17.0 g/dL   HCT 34.8 (L) 39.0 - 52.0 %   MCV 90.4 80.0 - 100.0 fL   MCH 28.8 26.0 - 34.0 pg   MCHC 31.9 30.0 - 36.0 g/dL   RDW 16.3 (H) 11.5 - 15.5 %   Platelets 224 150 - 400 K/uL   nRBC 0.0 0.0 - 0.2 %    Comment: Performed at [facility], [place] [address], [place], [place] [address]  Urinalysis, Routine w reflex microscopic     Status: Abnormal   Collection Time: 04/14/19 10:15 PM  Result Value Ref Range   Color, Urine YELLOW YELLOW   APPearance CLEAR CLEAR   Specific Gravity, Urine 1.018 1.005 - 1.030   pH 5.0 5.0 - 8.0   Glucose, UA NEGATIVE NEGATIVE mg/dL   Hgb urine dipstick NEGATIVE NEGATIVE   Bilirubin Urine NEGATIVE NEGATIVE   Ketones, ur NEGATIVE NEGATIVE mg/dL   Protein, ur NEGATIVE NEGATIVE mg/dL   Nitrite NEGATIVE NEGATIVE   Leukocytes,Ua TRACE (A) NEGATIVE   RBC / HPF 0-5 0 - 5 RBC/hpf   WBC, UA 0-5 0 - 5 WBC/hpf   Bacteria, UA NONE SEEN NONE SEEN   Squamous Epithelial / LPF 0-5 0 - 5   Mucus PRESENT     Comment: Performed at [facility], [place] [address], [place], [place] [address]   Mr Brain Wo Contrast  Result Date: 04/15/2019 CLINICAL DATA:  Dizziness for 3-4 days. EXAM: MRI HEAD WITHOUT CONTRAST TECHNIQUE: Multiplanar, multiecho pulse sequences of the brain and surrounding structures were obtained without intravenous  contrast. COMPARISON:  None. FINDINGS: BRAIN: There are multiple small acute infarcts of the right cerebellar hemisphere. No acute ischemia elsewhere in the brain. Multifocal white matter hyperintensity, most commonly due to chronic ischemic microangiopathy. There is generalized atrophy without lobar predilection.  The midline structures are normal. There is an old left cerebellar infarct. VASCULAR: The major intracranial arterial and venous sinus flow voids are normal. Single focus of chronic microhemorrhage in the left hemisphere no acute hemorrhage. SKULL AND UPPER CERVICAL SPINE: Calvarial bone marrow signal is normal. There is no skull base mass. The visualized upper cervical spine and soft tissues are normal. SINUSES/ORBITS: There are no fluid levels or advanced mucosal thickening. The mastoid air cells and middle ear cavities are free of fluid. The orbits are normal. IMPRESSION: 1. 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 2. No hemorrhage or mass effect. 3. Atrophy and chronic microvascular ischemia. Electronically Signed   By: Ulyses Jarred M.D.   On: 04/15/2019 04:48    Assessment: 85 y.o. male with small acute ischemic infarctions of the right cerebellar hemisphere.  1. Exam reveals no ataxia. 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 2. 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 Atrophy and chronic microvascular ischemia also noted. 3. Stroke Risk Factors - DM, HTN  4. Recent left eye injury.   Plan: 1. HgbA1c, fasting lipid panel 2. MRA of the brain without contrast 3. PT consult, OT consult, Speech consult 4. Echocardiogram 5. Carotid dopplers 6. Prophylactic therapy- Start ASA 7. Risk factor modification 8. Telemetry monitoring 9. Frequent neuro checks 10. Benefits of statin most likely outweighed by risks given advanced age.  11. BP management. Out of permissive HTN time window.  12. MRI ot  thoracic spine to assess for possible severe stenosis given relative's endorsement of shuffling gait as well as the pathological right patellar reflex, which are not attributable to the cerebellar stroke.   13. Scleral injection on the left with patchy corneal opacification. Most likely due to the projectile injury he states occurred one week ago. Consider obtaining an ophthalmology consultation.    @Electronically  signed: Dr. Kerney Elbe  04/15/2019, 5:42 AM

## 2019-04-15 NOTE — ED Notes (Signed)
Patient transported to MRI 

## 2019-04-15 NOTE — ED Notes (Signed)
Lunch tray ordered 

## 2019-04-15 NOTE — ED Notes (Signed)
Hooked patient up to the monitor patient is resting with call bell in reach 

## 2019-04-15 NOTE — Progress Notes (Signed)
Dr. Nevada Crane (radiology) calling regarding patient's MRI.  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  This is likely chronic given lack of signal changes other than cerebellar findings.  He does have collateral flow from the circle of Willis.  R carotid is without significant stenosis.  Likely no indication for vascular intervention at this time.   Carlyon Shadow, M.D.

## 2019-04-15 NOTE — ED Provider Notes (Signed)
[facility] EMERGENCY DEPARTMENT Provider Note   CSN: [id number] Arrival date & time: 04/14/19  1653     History   Chief Complaint Chief Complaint  Patient presents with  . Dizziness    HPI Kenneth Bennett is a 85 y.o. male.     Patient is a an 85 year old male with history of hypertension and diabetes.  He presents today for evaluation of dizziness and headache.  This began yesterday.  According to the family member at bedside, he has been unsteady with ambulation and has been shuffling his legs different than normal.  Patient somewhat a difficult historian and is unable to describe whether his dizziness is a spinning sensation or lightheadedness.  The history is provided by the patient.  Dizziness Quality:  Imbalance Severity:  Moderate Onset quality:  Sudden Duration:  2 days Timing:  Constant Progression:  Unchanged Chronicity:  New Relieved by:  Nothing Worsened by:  Nothing Ineffective treatments:  None tried   Past Medical History:  Diagnosis Date  . Cataracts, both eyes    Hx: of  . Diabetes mellitus without complication ([place])   . Hypertension     There are no active problems to display for this patient.   Past Surgical History:  Procedure Laterality Date  . CATARACT EXTRACTION W/PHACO Left 11/16/2012   Procedure: CATARACT EXTRACTION PHACO AND INTRAOCULAR LENS PLACEMENT (IOC);  Surgeon: Adonis Brook, MD;  Location: [facility];  Service: Ophthalmology;  Laterality: Left;  . COLONOSCOPY     Hx: of  . HERNIA REPAIR          Home Medications    Prior to Admission medications   Medication Sig Start Date End Date Taking? Authorizing Provider  aspirin EC 81 MG tablet Take 81 mg by mouth daily.    [provider]  HYDROcodone-acetaminophen (NORCO/VICODIN) 5-325 MG tablet Take 1 tablet by mouth every 6 (six) hours as needed. 08/09/15   Montine Circle, PA-C  methyldopa (ALDOMET) 250 MG tablet Take 500 mg by mouth daily with  breakfast.     [provider]  rosuvastatin (CRESTOR) 20 MG tablet Take 20 mg by mouth daily with breakfast.     [provider]    Family History No family history on file.  Social History Social History   Tobacco Use  . Smoking status: Former Smoker    Quit date: 12/02/1983    Years since quitting: 35.3  . Smokeless tobacco: Never Used  Substance Use Topics  . Alcohol use: Yes    Alcohol/week: 0.0 standard drinks    Comment: occasional  . Drug use: No     Allergies   Patient has no known allergies.   Review of Systems Review of Systems  Neurological: Positive for dizziness.  All other systems reviewed and are negative.    Physical Exam Updated Vital Signs BP (!) 177/92   Pulse 67   Temp (!) 97.4 F (36.3 C) (Oral)   Resp 20   Ht 5\' 6"  (1.676 m)   Wt 87.5 kg   SpO2 100%   BMI 31.14 kg/m   Physical Exam Vitals signs and nursing note reviewed.  Constitutional:      General: He is not in acute distress.    Appearance: He is well-developed. He is not diaphoretic.  HENT:     Head: Normocephalic and atraumatic.     Mouth/Throat:     Mouth: Mucous membranes are moist.  Eyes:     Extraocular Movements: Extraocular movements  intact.     Pupils: Pupils are equal, round, and reactive to light.  Neck:     Musculoskeletal: Normal range of motion and neck supple.  Cardiovascular:     Rate and Rhythm: Normal rate and regular rhythm.     Heart sounds: No murmur. No friction rub.  Pulmonary:     Effort: Pulmonary effort is normal. No respiratory distress.     Breath sounds: Normal breath sounds. No wheezing or rales.  Abdominal:     General: Bowel sounds are normal. There is no distension.     Palpations: Abdomen is soft.     Tenderness: There is no abdominal tenderness.  Musculoskeletal: Normal range of motion.  Skin:    General: Skin is warm and dry.  Neurological:     Mental Status: He is alert and oriented to person, place, and time.      Cranial Nerves: No cranial nerve deficit.     Motor: No weakness.     Coordination: Coordination normal.     Gait: Gait abnormal.      ED Treatments / Results  Labs (all labs ordered are listed, but only abnormal results are displayed) Labs Reviewed  COMPREHENSIVE METABOLIC PANEL - Abnormal; Notable for the following components:      Result Value   CO2 19 (*)    Glucose, Bld 134 (*)    BUN 36 (*)    Creatinine, Ser 2.48 (*)    GFR calc non Af Amer 23 (*)    GFR calc Af Amer 27 (*)    All other components within normal limits  CBC - Abnormal; Notable for the following components:   RBC 3.85 (*)    Hemoglobin 11.1 (*)    HCT 34.8 (*)    RDW 16.3 (*)    All other components within normal limits  URINALYSIS, ROUTINE W REFLEX MICROSCOPIC - Abnormal; Notable for the following components:   Leukocytes,Ua TRACE (*)    All other components within normal limits  LIPASE, BLOOD    EKG None  Radiology No results found.  Procedures Procedures (including critical care time)  Medications Ordered in ED Medications  sodium chloride flush (NS) 0.9 % injection 3 mL (3 mLs Intravenous Not Given 04/15/19 0108)     Initial Impression / Assessment and Plan / ED Course  I have reviewed the triage vital signs and the nursing notes.  Pertinent labs & imaging results that were available during my care of the patient were reviewed by me and considered in my medical decision making (see chart for details).  Patient presenting with complaints of difficulty walking and dizziness that started yesterday.  Patient neurologically intact upon presentation with stable vital signs.  Patient's work-up shows unremarkable laboratory studies, but MRI of the brain does show multiple small right-sided cerebellar infarcts.  This finding was discussed with Dr. Cheral Marker from neurology.  It is his recommendation that the patient be admitted for stroke work-up.  I have spoken with Dr. Marlowe Sax who agrees to  admit.  CRITICAL CARE Performed by: Veryl Speak Total critical care time: 45 minutes Critical care time was exclusive of separately billable procedures and treating other patients. Critical care was necessary to treat or prevent imminent or life-threatening deterioration. Critical care was time spent personally by me on the following activities: development of treatment plan with patient and/or surrogate as well as nursing, discussions with consultants, evaluation of patient's response to treatment, examination of patient, obtaining history from patient or surrogate, ordering and  performing treatments and interventions, ordering and review of laboratory studies, ordering and review of radiographic studies, pulse oximetry and re-evaluation of patient's condition.   Final Clinical Impressions(s) / ED Diagnoses   Final diagnoses:  None    ED Discharge Orders    None       Veryl Speak, MD 04/15/19 [phone]

## 2019-04-15 NOTE — Progress Notes (Signed)
STROKE TEAM PROGRESS NOTE   HISTORY OF PRESENT ILLNESS (per record) Kenneth Bennett is an 85 y.o. male with DM, cataracts and HTN who presented to the [facility] ED in the early evening on Friday with a c/c of dizziness for 3-4 days. He describes the dizziness as not being a spinning sensation, but rather a "heavy head" sensation. He also endorses new onset of unsteadiness while ambulating. He denies arm incoordination. He did have some double vision, which has since resolved. He denies trouble with speech.  MRI obtained in the ED reveals small acute infarctions within the right cerebellar hemisphere.  He has no prior history of stroke or MI. He does not take ASA and is not on a blood thinner. He was on ASA at some point but "my doctor took me off of it".  He states that he has a "red eye" on the left due to an object striking his eye while mowing his lawn about one week ago.  LSN: 3-4 days prior to admission tPA Given: No: Out of time window   INTERVAL HISTORY I have personally reviewed history of presenting illness with the patient.  He developed sudden onset of dizziness and gait imbalance yesterday.  MRI does confirm small patchy right cerebellar infarcts.  He states he is feeling much better.  Rest of his stroke work-up is yet pending     OBJECTIVE Vitals:   04/15/19 0330 04/15/19 0545 04/15/19 0712 04/15/19 0818  BP: (!) 188/85 (!) 181/88  (!) 181/81  Pulse: 64 89 90 85  Resp: 20 20 20 17   Temp:      TempSrc:      SpO2: 100% 96% 93% 95%  Weight:      Height:        CBC:  Recent Labs  Lab 04/14/19 1759  WBC 6.6  HGB 11.1*  HCT 34.8*  MCV 90.4  PLT [id number]    Basic Metabolic Panel:  Recent Labs  Lab 04/14/19 1759  NA 139  K 4.9  CL 110  CO2 19*  GLUCOSE 134*  BUN 36*  CREATININE 2.48*  CALCIUM 9.4    Lipid Panel:     Component Value Date/Time   CHOL  01/20/2010 0500    175        ATP III CLASSIFICATION:  <200     mg/dL   Desirable  200-239  mg/dL   Borderline  High  >=240    mg/dL   High          TRIG 140 01/20/2010 0500   HDL 24 (L) 01/20/2010 0500   CHOLHDL 7.3 01/20/2010 0500   VLDL 28 01/20/2010 0500   LDLCALC (H) 01/20/2010 0500    123        Total Cholesterol/HDL:CHD Risk Coronary Heart Disease Risk Table                     Men   Women  1/2 Average Risk   3.4   3.3  Average Risk       5.0   4.4  2 X Average Risk   9.6   7.1  3 X Average Risk  23.4   11.0        Use the calculated Patient Ratio above and the CHD Risk Table to determine the patient's CHD Risk.        ATP III CLASSIFICATION (LDL):  <100     mg/dL   Optimal  100-129  mg/dL  Near or Above                    Optimal  130-159  mg/dL   Borderline  160-189  mg/dL   High  >190     mg/dL   Very High   HgbA1c: No results found for: HGBA1C Urine Drug Screen: No results found for: LABOPIA, COCAINSCRNUR, LABBENZ, AMPHETMU, THCU, LABBARB  Alcohol Level No results found for: [facility]  IMAGING  Mr Brain Wo Contrast 04/15/2019 IMPRESSION:  1. 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  2. No hemorrhage or mass effect.  3. Atrophy and chronic microvascular ischemia.    Transthoracic Echocardiogram  00/00/2020 Pending No results found for this or any previous visit (from the past 43800 hour(s)).   Bilateral Carotid Dopplers  00/00/2020 Pending   ECG sinus rhythm.  Prolonged PR interval.  Old anterior infarct.   EEG pending  PHYSICAL EXAM Blood pressure (!) 181/81, pulse 85, temperature (!) 97.4 F (36.3 C), temperature source Oral, resp. rate 17, height 5\' 6"  (1.676 m), weight 87.5 kg, SpO2 95 %. Pleasant elderly African-American male not in distress.  He has conjunctival redness in the left eye. Neurological Exam ;  Awake  Alert oriented x 3. Normal speech and language.eye movements full without nystagmus.fundi were not visualized. Vision acuity and fields appear normal. Hearing is normal. Palatal movements are normal. Face symmetric. Tongue  midline. Normal strength, tone, reflexes and coordination. Normal sensation. Gait deferred.     ASSESSMENT/PLAN [name] is a 85 y.o. male with history of Htn, DM, and cataracts presenting with c/c of dizziness for 3-4 days, unsteadiness while ambulating and some double vision. He did not receive IV t-PA due to late presentation (>4.5 hours from time of onset).  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 Atrophy and chronic microvascular ischemia.   MRA H&N - pending  CTA H&N - not ordered  CT Perfusion - not ordered  Carotid Doppler - MRA neck pending - carotid dopplers not indicated.  2D Echo - pending  Lacey Jensen Virus 2 - negative  LDL - pending  HgbA1c - pending  UDS - pending  VTE prophylaxis - Lovenox Diet  Diet Order            Diet heart healthy/carb modified Room service appropriate? Yes; Fluid consistency: Thin  Diet effective now               Prior to admission - not on file, now on aspirin 325 mg daily  Patient counseled to be compliant with his antithrombotic medications  Ongoing aggressive stroke risk factor management  Therapy recommendations:  pending  Disposition:  Pending  Hypertension  Home BP meds: - not on file  Current BP meds: none  Blood pressure somewhat high at times but within post stroke/TIA parameters . Permissive hypertension (OK if < 220/120) but gradually normalize in 5-7 days  . Long-term BP goal normotensive  Hyperlipidemia  Home Lipid lowering medication: - not on file  LDL - pending, goal < 70  Current lipid lowering medication: None (await LDL results)  Continue statin at discharge  Diabetes  Home diabetic meds: - not on file  Current diabetic meds: SSI  HgbA1c - pending, goal < 7.0  No results for  input(s):  GLUCAP in the last 72 hours.  Other Stroke Risk Factors  Advanced age  Former cigarette smoker - quit  ETOH use, advised to drink no more than 1 alcoholic beverage per day.  Obesity, Body mass index is 31.14 kg/m., recommend weight loss, diet and exercise as appropriate   Other Active Problems  CKD - creatinine - 2.48  Mild anemia - Hb - 11.1   Hospital day # 0  I have personally obtained history,examined this patient, reviewed notes, independently viewed imaging studies, participated in medical decision making and plan of care.ROS completed by me personally and pertinent positives fully documented  I have made any additions or clarifications directly to the above note. Agree with note above.  He presented with dizziness due to cerebellar infarct etiology to be determined.  Continue ongoing stroke work-up and close neurological monitoring.  Recommend aspirin and Plavix for 3 weeks followed by aspirin.  Aggressive risk factor modification.  Physical occupational therapy consults.  Discussed with Dr. Lorin Mercy.  Greater than 50% time during this 35-minute visit was spent on counseling and coordination of care about his cerebellar strokes and discussion about evaluation and treatment plan and answering questions.  Antony Contras, MD Medical Director [facility] Pager: [phone] 04/15/2019 2:07 PM   To contact Stroke Continuity provider, please refer to [email or web address] After hours, contact General Neurology

## 2019-04-15 NOTE — ED Notes (Signed)
Neurology at the bedside

## 2019-04-15 NOTE — H&P (Signed)
History and Physical    Kenneth Bennett [id number] DOB: [birth year] DOA: 04/14/2019  PCP:  None Consultants:  None Patient coming from:  Home - lives with wife; [place]: Wife, [phone], [phone]  Chief Complaint: Dizziness  HPI: Kenneth Bennett is a 85 y.o. male with medical history significant of HTN and DM presenting with dizziness.  He reports he was "dizzy-headed."  Symptoms started Thursday.  He moved the grass and suddenly got dizzy and felt like he was going to fall.  It went away but came back again the next morning.  No dysphagia, dysarthria, no N/W/T.  His dizziness is currently resolved.    ED Course: [place], per Dr. Marlowe Sax:  Patient with history of hypertension hyperlipidemia presenting with a 1 day history of feeling off balance/trouble walking. MRI done in the ED showing acute multiple small cerebellar infarcts. Neuro exam nonfocal. Dr. Cheral Marker from neurology to see the patient.   Review of Systems: As per HPI; otherwise review of systems reviewed and negative.   Ambulatory Status:  Ambulates without assistance  Past Medical History:  Diagnosis Date   Cataracts, both eyes    Hx: of   CKD (chronic kidney disease) stage 4, GFR 15-29 ml/min ([facility]) 04/15/2019   CVA (cerebral vascular accident) ([place])    Diabetes mellitus without complication ([place])    denies history   Hypertension    not taking medicine recently    Past Surgical History:  Procedure Laterality Date   CATARACT EXTRACTION W/PHACO Left 11/16/2012   Procedure: CATARACT EXTRACTION PHACO AND INTRAOCULAR LENS PLACEMENT ([place]);  Surgeon: Adonis Brook, MD;  Location: [facility];  Service: Ophthalmology;  Laterality: Left;   COLONOSCOPY     Hx: of   HERNIA REPAIR      Social History   Socioeconomic History   Marital status: Married    Spouse name: Not on file   Number of children: Not on file   Years of education: Not on file   Highest education level: Not on file  Occupational  History   Occupation: retired  Scientist, product/process development strain: Not on file   Food insecurity    Worry: Not on file    Inability: Not on file   Transportation needs    Medical: Not on file    Non-medical: Not on file  Tobacco Use   Smoking status: Former Smoker    Quit date: 12/02/1983    Years since quitting: 35.3   Smokeless tobacco: Never Used  Substance and Sexual Activity   Alcohol use: Yes    Alcohol/week: 0.0 standard drinks    Comment: occasional - 2-3 beers on the weekend   Drug use: No   Sexual activity: Not on file  Lifestyle   Physical activity    Days per week: Not on file    Minutes per session: Not on file   Stress: Not on file  Relationships   Social connections    Talks on phone: Not on file    Gets together: Not on file    Attends religious service: Not on file    Active member of club or organization: Not on file    Attends meetings of clubs or organizations: Not on file    Relationship status: Not on file   Intimate partner violence    Fear of current or ex partner: Not on file    Emotionally abused: Not on file    Physically abused: Not on file    Forced sexual  activity: Not on file  Other Topics Concern   Not on file  Social History Narrative   Not on file    No Known Allergies  Family History  Problem Relation Age of Onset   Stroke Neg Hx     Prior to Admission medications   Medication Sig Start Date End Date Taking? Authorizing Provider  HYDROcodone-acetaminophen (NORCO/VICODIN) 5-325 MG tablet Take 1 tablet by mouth every 6 (six) hours as needed. Patient not taking: Reported on 04/15/2019 08/09/15   Montine Circle, PA-C    Physical Exam: Vitals:   04/15/19 0818 04/15/19 0900 04/15/19 0945 04/15/19 1101  BP: (!) 181/81 (!) 178/72 (!) 182/79 (!) 182/79  Pulse: 85 70 68 88  Resp: 17 18 19 20   Temp:      TempSrc:      SpO2: 95% 98% 94% 99%  Weight:      Height:          General:  Appears calm and  comfortable and is NAD  Eyes:  PERRL, EOMI, normal lids, iris  ENT:  grossly normal hearing, lips & tongue, mmm  Neck:  no LAD, masses or thyromegaly; no carotid bruits  Cardiovascular:  RRR, no r/g, 99991111 systolic murmur. No LE edema.   Respiratory:   CTA bilaterally with no wheezes/rales/rhonchi.  Normal respiratory effort.  Abdomen:  soft, NT, ND, NABS  Back:   normal alignment, no CVAT  Skin:  no rash or induration seen on limited exam  Musculoskeletal:  grossly normal tone BUE/BLE, good ROM, no bony abnormality  Psychiatric:  grossly normal mood and affect, speech fluent and appropriate, AOx3  Neurologic:  CN 2-12 grossly intact, moves all extremities in coordinated fashion, sensation intact    Radiological Exams on Admission: Mr Brain Wo Contrast  Result Date: 04/15/2019 CLINICAL DATA:  Dizziness for 3-4 days. EXAM: MRI HEAD WITHOUT CONTRAST TECHNIQUE: Multiplanar, multiecho pulse sequences of the brain and surrounding structures were obtained without intravenous contrast. COMPARISON:  None. FINDINGS: BRAIN: There are multiple small acute infarcts of the right cerebellar hemisphere. No acute ischemia elsewhere in the brain. Multifocal white matter hyperintensity, most commonly due to chronic ischemic microangiopathy. There is generalized atrophy without lobar predilection. The midline structures are normal. There is an old left cerebellar infarct. VASCULAR: The major intracranial arterial and venous sinus flow voids are normal. Single focus of chronic microhemorrhage in the left hemisphere no acute hemorrhage. SKULL AND UPPER CERVICAL SPINE: Calvarial bone marrow signal is normal. There is no skull base mass. The visualized upper cervical spine and soft tissues are normal. SINUSES/ORBITS: There are no fluid levels or advanced mucosal thickening. The mastoid air cells and middle ear cavities are free of fluid. The orbits are normal. IMPRESSION: 1. Multiple small acute infarcts of  the right cerebellum. 2. No hemorrhage or mass effect. 3. Atrophy and chronic microvascular ischemia. Electronically Signed   By: Ulyses Jarred M.D.   On: 04/15/2019 04:48    EKG: Independently reviewed.  NSR with rate 64; nonspecific ST changes with no evidence of acute ischemia   Labs on Admission: I have personally reviewed the available labs and imaging studies at the time of the admission.  Pertinent labs:   CO2 19 Glucose 134 BUN 36/Creatinine 2.48/GFR 27; 32/2.00/35 in 2014 WBC 6.6 Hgb 11.1 UA: trace LE COVID negative   Assessment/Plan Principal Problem:   Acute CVA (cerebrovascular accident) [facility]) Active Problems:   Essential hypertension   Diabetes with neurologic complications (HCC)   CKD (chronic kidney  disease) stage 4, GFR 15-29 ml/min ([facility])   CVA -Patient presenting with dizziness as his only symptom -Somewhat concerning for TIA/CVA -MRI confirmed CVA -Will admit for further CVA evaluation -Telemetry monitoring -MRA -Carotid dopplers -Echo -Risk stratification with FLP, A1c; will also check TSH and UDS -ASA daily -Neurology consult -PT/OT/ST/Nutrition Consults  HTN -Allow permissive HTN for now -Treat BP only if >220/120, and then with goal of 15% reduction -He has not been compliant with BP medications in quite some time   HLD -Check FLP - last checked in 2011, 175/24/123/140 -Will start Lipitor 40 mg daily   DM -Reported prior h/o DM -Has not been taking medications for this issue, either -Will check A1c -Will cover for now with moderate-scale SSI -May need DM coordinator consultation  CKD -Patient with GFR in low 30s as far back as 2011, now in upper 20s -This appears to be c/w stage 4 CKD, likely associated with long-standing uncontrolled HTN -Will follow and will need outpatient PCP f/u    Note: This patient has been tested and is negative for the novel coronavirus COVID-19.      DVT prophylaxis:  Lovenox  Code Status: Full -  confirmed with patient Family Communication: None present Disposition Plan:  Home once clinically improved Consults called: Neurology; PT/OT/ST/Nutrition  Admission status: Admit - It is my clinical opinion that admission to INPATIENT is reasonable and necessary because of the expectation that this patient will require hospital care that crosses at least 2 midnights to treat this condition based on the medical complexity of the problems presented.  Given the aforementioned information, the predictability of an adverse outcome is felt to be significant.     Karmen Bongo MD [facility]   How to contact the [facility] Attending or Consulting provider [place] or covering provider during after hours [place], for this patient?  1. Check the care team in [facility] and look for a) attending/consulting [facility] provider listed and b) the [facility] team listed 2. Log into [email or web address] and use [place]'s universal password to access. If you do not have the password, please contact the hospital operator. 3. Locate the [facility] provider you are looking for under [facility] and page to a number that you can be directly reached. 4. If you still have difficulty reaching the provider, please page the [facility] (Director on Call) for the Hospitalists listed on amion for assistance.   04/15/2019, 11:55 AM

## 2019-04-15 NOTE — Evaluation (Signed)
Physical Therapy Evaluation Patient Details Name: Kenneth Bennett MRN: [id number] DOB: 09-01-1933 Today's Date: 04/15/2019   History of Present Illness  Kenneth Bennett is a 85 y.o. male with medical history significant of HTN and DM presenting with dizziness.  He reports he was "dizzy-headed."   Symptoms went away but returned next day. MRI showed multiple small cerebellar infarcts  Clinical Impression  Pt admitted with above diagnosis. Pt verbally reports that all symptoms have resolved, however, in standing and with ambulation, pt is unsteady with mildly ataxic gait and needing min A to correct for LOB.  Pt currently with functional limitations due to the deficits listed below (see PT Problem List). Pt will benefit from skilled PT to increase their independence and safety with mobility to allow discharge to the venue listed below.       Follow Up Recommendations Outpatient PT    Equipment Recommendations  None recommended by PT    Recommendations for Other Services       Precautions / Restrictions Precautions Precautions: Fall Restrictions Weight Bearing Restrictions: No      Mobility  Bed Mobility Overal bed mobility: Modified Independent                Transfers Overall transfer level: Needs assistance   Transfers: Sit to/from Stand Sit to Stand: Min guard         General transfer comment: unsteady with initial standing, used stepping strategy  Ambulation/Gait Ambulation/Gait assistance: Min assist Gait Distance (Feet): 300 Feet Assistive device: None Gait Pattern/deviations: Staggering left;Staggering right;Narrow base of support Gait velocity: WFL Gait velocity interpretation: >2.62 ft/sec, indicative of community ambulatory General Gait Details: pt with several LOB requiring min A to correct, occasional staggering gait and decreased step ht LLE, mild ataxia  Stairs            Wheelchair Mobility    Modified Rankin (Stroke Patients  Only) Modified Rankin (Stroke Patients Only) Pre-Morbid Rankin Score: No symptoms Modified Rankin: Slight disability     Balance Overall balance assessment: Needs assistance Sitting-balance support: Feet supported;No upper extremity supported Sitting balance-Leahy Scale: Good     Standing balance support: No upper extremity supported Standing balance-Leahy Scale: Fair                               Pertinent Vitals/Pain Pain Assessment: No/denies pain    Home Living Family/patient expects to be discharged to:: Private residence Living Arrangements: Spouse/significant other Available Help at Discharge: Family;Available PRN/intermittently Type of Home: House Home Access: Level entry     Home Layout: One level Home Equipment: None Additional Comments: wife works from home, can be available intermittently    Prior Function Level of Independence: Independent         Comments: pt works in yard most of day, does not like being inside     Journalist, newspaper   Dominant Hand: Right    Extremity/Trunk Assessment   Upper Extremity Assessment Upper Extremity Assessment: Defer to OT evaluation    Lower Extremity Assessment Lower Extremity Assessment: Overall WFL for tasks assessed(R and L equal strength)    Cervical / Trunk Assessment Cervical / Trunk Assessment: Normal  Communication   Communication: No difficulties  Cognition Arousal/Alertness: Awake/alert Behavior During Therapy: WFL for tasks assessed/performed;Impulsive Overall Cognitive Status: Impaired/Different from baseline Area of Impairment: Safety/judgement;Problem solving  Safety/Judgement: Decreased awareness of deficits     General Comments: decreased insight into deficits though he can verbalize them so question cognitive dysfunction vs denial      General Comments General comments (skin integrity, edema, etc.): noted redness B eyes and appearance of  bulging of L eye. Wife reports she has not noticed this before    Exercises     Assessment/Plan    PT Assessment Patient needs continued PT services  PT Problem List Decreased balance;Decreased mobility;Decreased coordination;Decreased cognition;Decreased knowledge of precautions       PT Treatment Interventions DME instruction;Gait training;Stair training;Functional mobility training;Therapeutic activities;Therapeutic exercise;Balance training;Cognitive remediation;Patient/family education;Neuromuscular re-education    PT Goals (Current goals can be found in the Care Plan section)  Acute Rehab PT Goals Patient Stated Goal: return home, get out in yard PT Goal Formulation: With patient Time For Goal Achievement: 04/29/19 Potential to Achieve Goals: Good Additional Goals Additional Goal #1: pt to perform >45/56 on Berg balance    Frequency Min 4X/week   Barriers to discharge        Co-evaluation               AM-PAC PT "6 Clicks" Mobility  Outcome Measure Help needed turning from your back to your side while in a flat bed without using bedrails?: None Help needed moving from lying on your back to sitting on the side of a flat bed without using bedrails?: None Help needed moving to and from a bed to a chair (including a wheelchair)?: A Little Help needed standing up from a chair using your arms (e.g., wheelchair or bedside chair)?: A Little Help needed to walk in hospital room?: A Little Help needed climbing 3-5 steps with a railing? : A Little 6 Click Score: 20    End of Session Equipment Utilized During Treatment: Gait belt Activity Tolerance: Patient tolerated treatment well Patient left: in bed;with call bell/phone within reach;with family/visitor present Nurse Communication: Mobility status PT Visit Diagnosis: Unsteadiness on feet (R26.81);Ataxic gait (R26.0)    Time: 1540-1600 PT Time Calculation (min) (ACUTE ONLY): 20 min   Charges:   PT Evaluation $PT  Eval Moderate Complexity: [place]  Pager [phone] Office [place] 04/15/2019, 4:30 PM

## 2019-04-15 NOTE — Progress Notes (Signed)
Obtained report from [facility] ED RN, patient in MRI and will be transported to [id number]

## 2019-04-16 ENCOUNTER — Encounter (HOSPITAL_COMMUNITY): Payer: Medicare Other

## 2019-04-16 ENCOUNTER — Inpatient Hospital Stay (HOSPITAL_COMMUNITY): Payer: Medicare Other

## 2019-04-16 DIAGNOSIS — N184 Chronic kidney disease, stage 4 (severe): Secondary | ICD-10-CM

## 2019-04-16 DIAGNOSIS — I340 Nonrheumatic mitral (valve) insufficiency: Secondary | ICD-10-CM

## 2019-04-16 LAB — ECHOCARDIOGRAM COMPLETE
Height: 66 in
Weight: 3086.44 oz

## 2019-04-16 LAB — GLUCOSE, CAPILLARY
Glucose-Capillary: 82 mg/dL (ref 70–99)
Glucose-Capillary: 80 mg/dL (ref 70–99)
Glucose-Capillary: 78 mg/dL (ref 70–99)

## 2019-04-16 LAB — LIPID PANEL
Cholesterol: 220 mg/dL — ABNORMAL HIGH (ref 0–200)
HDL: 44 mg/dL (ref >40)
LDL Cholesterol: 153 mg/dL — ABNORMAL HIGH (ref 0–99)
Total CHOL/HDL Ratio: 5 RATIO
Triglycerides: 113 mg/dL (ref <150)
VLDL: 23 mg/dL (ref 0–40)

## 2019-04-16 MED ORDER — ENOXAPARIN SODIUM 30 MG/0.3ML ~~LOC~~ SOLN
30.0000 mg | SUBCUTANEOUS | Status: DC
  Administered 2019-04-16 – 2019-04-18 (×3): 30 mg via SUBCUTANEOUS
  Filled 2019-04-16 (×3): qty 0.3

## 2019-04-16 MED ORDER — LORAZEPAM 2 MG/ML IJ SOLN
0.5000 mg | Freq: Once | INTRAMUSCULAR | Status: AC
  Administered 2019-04-16: 02:00:00 0.5 mg via INTRAVENOUS
  Filled 2019-04-16: qty 1

## 2019-04-16 MED ORDER — CLOPIDOGREL BISULFATE 75 MG PO TABS
75.0000 mg | ORAL_TABLET | Freq: Every day | ORAL | Status: DC
  Administered 2019-04-16 – 2019-04-19 (×4): 75 mg via ORAL
  Filled 2019-04-16 (×5): qty 1

## 2019-04-16 MED ORDER — LORAZEPAM 2 MG/ML IJ SOLN
INTRAMUSCULAR | Status: AC
  Administered 2019-04-16: 02:00:00 0.5 mg via INTRAVENOUS
  Filled 2019-04-16: qty 1

## 2019-04-16 MED ORDER — ASPIRIN EC 81 MG PO TBEC
81.0000 mg | DELAYED_RELEASE_TABLET | Freq: Every day | ORAL | Status: DC
  Administered 2019-04-17 – 2019-04-19 (×3): 81 mg via ORAL
  Filled 2019-04-16 (×4): qty 1

## 2019-04-16 NOTE — Progress Notes (Signed)
STROKE TEAM PROGRESS NOTE     INTERVAL HISTORY Patient is lying in bed.  He appears slightly confused today.  MRA of the brain and neck are significant for chronic left common carotid artery occlusion and diminished flow in the left MCA and ACA through collaterals.   OBJECTIVE Vitals:   04/15/19 1509 04/15/19 2026 04/15/19 2326 04/16/19 0322  BP: (!) 172/101 127/79 (!) 165/98 (!) 171/92  Pulse: 75 81 83 81  Resp: 20 16 16 14   Temp: 97.7 F (36.5 C) 98.6 F (37 C) 98.8 F (37.1 C) 97.8 F (36.6 C)  TempSrc: Oral Oral Oral Oral  SpO2: 100% 100% 100% 97%  Weight:      Height:        CBC:  Recent Labs  Lab 04/14/19 1759  WBC 6.6  HGB 11.1*  HCT 34.8*  MCV 90.4  PLT [id number]    Basic Metabolic Panel:  Recent Labs  Lab 04/14/19 1759  NA 139  K 4.9  CL 110  CO2 19*  GLUCOSE 134*  BUN 36*  CREATININE 2.48*  CALCIUM 9.4    Lipid Panel:     Component Value Date/Time   CHOL 220 (H) 04/16/2019 0359   TRIG 113 04/16/2019 0359   HDL 44 04/16/2019 0359   CHOLHDL 5.0 04/16/2019 0359   VLDL 23 04/16/2019 0359   LDLCALC 153 (H) 04/16/2019 0359   HgbA1c: No results found for: HGBA1C Urine Drug Screen:     Component Value Date/Time   LABOPIA NONE DETECTED 04/15/2019 1055   COCAINSCRNUR NONE DETECTED 04/15/2019 1055   LABBENZ NONE DETECTED 04/15/2019 1055   AMPHETMU NONE DETECTED 04/15/2019 1055   THCU NONE DETECTED 04/15/2019 1055   LABBARB NONE DETECTED 04/15/2019 1055    Alcohol Level No results found for: [facility]  IMAGING  Mr Brain Wo Contrast 04/15/2019 IMPRESSION:  1. 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  2. No hemorrhage or mass effect.  3. Atrophy and chronic microvascular ischemia.   MRA Head and Neck 04/15/2019 IMPRESSION: 1. 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 No reconstituted flow until just before the left ICA terminus, which appears supplied by the left ophthalmic and posterior communicating  arteries. 2. The left MCA and ACA remain patent although with varying degrees of asymmetrically decreased flow signal compared to the right side vessels. 3. Cervical right ICA atherosclerosis without significant stenosis. 4. No posterior circulation abnormality identified to correspond with the small right cerebellar infarcts seen on MRI earlier today.   Transthoracic Echocardiogram  Normal ejection ejection fraction 50 to 55%.  No wall motion abnormalities.    ECG sinus rhythm.  Prolonged PR interval.  Old anterior infarct.    PHYSICAL EXAM Blood pressure (!) 171/92, pulse 81, temperature 97.8 F (36.6 C), temperature source Oral, resp. rate 14, height 5\' 6"  (1.676 m), weight 87.5 kg, SpO2 97 %. Pleasant elderly African-American male not in distress.  He has conjunctival redness in the left eye. Neurological Exam ;  Awake  Alert oriented x 3. Normal speech and language.eye movements full without nystagmus.fundi were not visualized. Vision acuity and fields appear normal. Hearing is normal. Palatal movements are normal. Face symmetric. Tongue midline. Normal strength, tone, reflexes and coordination. Normal sensation. Gait deferred.     ASSESSMENT/PLAN [name] is a 85 y.o. male with history of Htn, DM, and cataracts presenting with c/c of dizziness for 3-4 days, unsteadiness while ambulating and some double vision. He did  not receive IV t-PA due to late presentation (>4.5 hours from time of onset).  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 Atrophy and chronic microvascular ischemia.   MRA H&N - Positive for occlusion of the left common carotid artery at its origin  CTA H&N - not ordered  CT Perfusion - not ordered  Carotid Doppler - MRA neck pending - carotid dopplers not  indicated.  2D Echo -normal ejection fraction.  Sars Corona Virus 2 - negative  LDL - 153  HgbA1c - pending  UDS - negative  VTE prophylaxis - Lovenox Diet  Diet Order            Diet heart healthy/carb modified Room service appropriate? No; Fluid consistency: Thin  Diet effective now              Prior to admission - not on file, now on aspirin 325 mg daily  Patient counseled to be compliant with his antithrombotic medications  Ongoing aggressive stroke risk factor management  Therapy recommendations:  Outpt PT - OT eval pending  Disposition:  Pending  Hypertension  Home BP meds: - not on file  Current BP meds: none  Blood pressure somewhat high at times but within post stroke/TIA parameters . Permissive hypertension (OK if < 220/120) but gradually normalize in 5-7 days  . Long-term BP goal normotensive  Hyperlipidemia  Home Lipid lowering medication: - not on file  LDL - 153, goal < 70  Current lipid lowering medication: Now on Lipitor 40 mg daily  Continue statin at discharge  Diabetes  Home diabetic meds: - not on file  Current diabetic meds: SSI  HgbA1c - pending, goal < 7.0 Recent Labs    04/15/19 2118 04/16/19 0622 04/16/19 1208  GLUCAP 105* 82 80    Other Stroke Risk Factors  Advanced age  Former cigarette smoker - quit  ETOH use, advised to drink no more than 1 alcoholic beverage per day.  Obesity, Body mass index is 31.14 kg/m., recommend weight loss, diet and exercise as appropriate   Other Active Problems  CKD - creatinine - 2.48  Mild anemia - Hb - 11.1  PLAN   DAPT x 3 weeks followed by ASA alone - will order Plavix and decrease aspirin to 81 mg daily.    Hospital day # 1     He presented with dizziness due to cerebellar infarct etiology to be determined.  Continue ongoing stroke work-up and close neurological monitoring.  Recommend aspirin and Plavix for 3 weeks followed by aspirin.  Aggressive risk factor  modification.  May be discharged home with outpatient therapies.  Discussed with Dr. Emeline Gins .  Stroke team will sign off.  Kindly call for questions. Antony Contras, MD Medical Director [facility] Pager: [phone] 04/16/2019 3:27 PM   To contact Stroke Continuity provider, please refer to [email or web address] After hours, contact General Neurology

## 2019-04-16 NOTE — Progress Notes (Signed)
PROGRESS NOTE                                                                                                                                                                                                             Patient Demographics:    Kenneth Bennett, is a 85 y.o. male, DOB - [birth year], HA:[id number]  Admit date - 04/14/2019   Admitting Physician Shela Leff, MD  Outpatient Primary MD for the patient is Lucianne Lei, MD  LOS - 1   Chief Complaint  Patient presents with  . Dizziness       Brief Narrative    85 y.o. male with medical history significant of HTN and DM presenting with dizziness.  He reports he was "dizzy-headed."  Symptoms started Thursday.  He moved the grass and suddenly got dizzy and felt like he was going to fall.  MRI done in the ED showing acute multiple small cerebellar infarcts. Neuro exam nonfocal.    Subjective:    [name] today extremely confused, agitated, does not answer any questions appropriately   Assessment  & Plan :    Principal Problem:   Acute CVA (cerebrovascular accident) [facility]) Active Problems:   Essential hypertension   Diabetes with neurologic complications ([place])   CKD (chronic kidney disease) stage 4, GFR 15-29 ml/min (HCC)   Acute CVA -MRI head showing multiple small acute infarcts of the right cerebellum. -MRA head and neck significant for left common carotid artery occlusion at its origin. -2 D echo with EF 50 to 55%, no evidence of embolic source. -Continue with aspirin and Plavix. -LDL  elevated at 153 -PT/OT consulted  HTN -Allow permissive HTNfor now -Treat BP only if >220/120, and then with goal of 15% reduction -He has not been compliant with BP medications in quite some time  HLD -C LDL elevated at 154, continue with Lipitor  DM -Reported prior h/o DM -follow  on A1c -Continue with insulin sliding scale  CKD stage IV -Retaining remains at baseline  around 2, avoid nephrotoxic medication  Acute delirium -Patient is more confused, combative today, does not like in the setting of hospital delirium   Code Status : Full  Family Communication  :  None at bedside  Disposition Plan  : pending further work up  Barriers For Discharge : acute CVA work up  Consults  :  Neurology  Procedures  : None  DVT Prophylaxis  : Subcu Lovenox  Lab Results  Component Value Date   PLT 224 04/14/2019    Antibiotics  :    Anti-infectives (From admission, onward)   None        Objective:   Vitals:   04/15/19 1509 04/15/19 2026 04/15/19 2326 04/16/19 0322  BP: (!) 172/101 127/79 (!) 165/98 (!) 171/92  Pulse: 75 81 83 81  Resp: 20 16 16 14   Temp: 97.7 F (36.5 C) 98.6 F (37 C) 98.8 F (37.1 C) 97.8 F (36.6 C)  TempSrc: Oral Oral Oral Oral  SpO2: 100% 100% 100% 97%  Weight:      Height:        Wt Readings from Last 3 Encounters:  04/14/19 87.5 kg  01/22/15 87.5 kg  11/16/12 84 kg     Intake/Output Summary (Last 24 hours) at 04/16/2019 1455 Last data filed at 04/16/2019 0300 Gross per 24 hour  Intake 770.32 ml  Output -  Net 770.32 ml     Physical Exam  Awake , confused, trying to get out of bed, does not follow commands Symmetrical Chest wall movement, Good air movement bilaterally, CTAB RRR,No Gallops,Rubs or new Murmurs, No Parasternal Heave +ve B.Sounds, Abd Soft, No tenderness,  No rebound - guarding or rigidity. No Cyanosis, Clubbing or edema, No new Rash or bruise      Data Review:    CBC Recent Labs  Lab 04/14/19 1759  WBC 6.6  HGB 11.1*  HCT 34.8*  PLT 224  MCV 90.4  MCH 28.8  MCHC 31.9  RDW 16.3*    Chemistries  Recent Labs  Lab 04/14/19 1759  NA 139  K 4.9  CL 110  CO2 19*  GLUCOSE 134*  BUN 36*  CREATININE 2.48*  CALCIUM 9.4  AST 17  ALT 10  ALKPHOS 88  BILITOT 0.4    ------------------------------------------------------------------------------------------------------------------ Recent Labs    04/16/19 0359  CHOL 220*  HDL 44  LDLCALC 153*  TRIG 113  CHOLHDL 5.0    No results found for: HGBA1C ------------------------------------------------------------------------------------------------------------------ Recent Labs    04/15/19 1055  TSH 2.569   ------------------------------------------------------------------------------------------------------------------ No results for input(s): VITAMINB12, FOLATE, FERRITIN, TIBC, IRON, RETICCTPCT in the last 72 hours.  Coagulation profile No results for input(s): INR, PROTIME in the last 168 hours.  No results for input(s): DDIMER in the last 72 hours.  Cardiac Enzymes No results for input(s): CKMB, TROPONINI, MYOGLOBIN in the last 168 hours.  Invalid input(s): CK ------------------------------------------------------------------------------------------------------------------ No results found for: BNP  Inpatient Medications  Scheduled Meds: . [START ON 04/17/2019] aspirin EC  81 mg Oral Daily  . atorvastatin  40 mg Oral q1800  . clopidogrel  75 mg Oral Daily  . enoxaparin (LOVENOX) injection  30 mg Subcutaneous Q24H  . insulin aspart  0-15 Units Subcutaneous TID WC   Continuous Infusions: . sodium chloride 50 mL/hr at 04/15/19 2032   PRN Meds:.acetaminophen **OR** acetaminophen (TYLENOL) oral liquid 160 mg/5 mL **OR** acetaminophen, senna-docusate  Micro Results Recent Results (from the past 240 hour(s))  SARS CORONAVIRUS 2 (TAT 6-24 HRS) Nasopharyngeal Nasopharyngeal Swab     Status: None   Collection Time: 04/15/19  5:50 AM   Specimen: Nasopharyngeal Swab  Result Value Ref Range Status   SARS Coronavirus 2 NEGATIVE NEGATIVE Final    Comment: (NOTE) SARS-CoV-2 target nucleic acids are NOT DETECTED. The SARS-CoV-2 RNA is generally detectable in upper and lower respiratory  specimens during the  acute phase of infection. Negative results do not preclude SARS-CoV-2 infection, do not rule out co-infections with other pathogens, and should not be used as the sole basis for treatment or other patient management decisions. Negative results must be combined with clinical observations, patient history, and epidemiological information. The expected result is Negative. Fact Sheet for Patients: SugarRoll.be Fact Sheet for Healthcare Providers: https://www.woods-mathews.com/ This test is not yet approved or cleared by the [place] FDA and  has been authorized for detection and/or diagnosis of SARS-CoV-2 by FDA under an Emergency Use Authorization (EUA). This EUA will remain  in effect (meaning this test can be used) for the duration of the COVID-19 declaration under Section 56 4(b)(1) of the Act, 21 U.S.C. section 360bbb-3(b)(1), unless the authorization is terminated or revoked sooner. Performed at [facility], [place] [address], [place], [place] [address]     Radiology Reports Mr Angio Head Wo Contrast  Addendum Date: 04/15/2019   ADDENDUM REPORT: 04/15/2019 15:35 ADDENDUM: Study discussed by telephone with Dr. Karmen Bongo on 04/15/2019 at 1519 hours. Electronically Signed   By: Genevie Ann M.D.   On: 04/15/2019 15:35   Result Date: 04/15/2019 CLINICAL DATA:  85 year old male with dizziness found to have multiple small acute infarcts in the right cerebellum on MRI earlier today. EXAM: MRA HEAD WITHOUT CONTRAST MRA NECK WITHOUT CONTRAST TECHNIQUE: Angiographic images of the Circle of Willis were obtained using MRA technique without intravenous contrast. Angiographic images of the neck were obtained using MRA technique without intravenous contrast. Carotid stenosis measurements (when applicable) are obtained utilizing NASCET criteria, using the distal internal carotid diameter as the denominator. COMPARISON:  Brain MRI  earlier today. FINDINGS: MRA NECK FINDINGS Time-of-flight images demonstrate a 3 vessel arch configuration with occlusion of the left common carotid artery at its origin (series 4, image 148). Antegrade flow in the right CCA and both subclavian arteries. Antegrade flow continues in the right carotid through the right carotid bifurcation and in the ICA to the skull base. Mild irregularity occurs at the right ICA origin and bulb with less than 50 % stenosis with respect to the distal vessel. Absent antegrade flow throughout the left carotid to the skull base. No proximal right subclavian artery or right vertebral artery origin stenosis suspected. The right vertebral is patent to the skull base without stenosis. No proximal left subclavian or left vertebral artery origin stenosis. The left vertebral is patent with antegrade flow to the skull base. Grossly negative visible neck and upper chest. MRA HEAD FINDINGS No intracranial mass effect or ventriculomegaly. Antegrade flow in the posterior circulation. The distal vertebral arteries appear codominant and are patent to the vertebrobasilar junction. Normal right PICA origin. The left PICA has a proximal origin which is patent. Patent vertebrobasilar junction and basilar artery. Patent SCA and PCA origins with prominent bilateral posterior communicating arteries. The right PCA has a fetal type origin. Bilateral PCA branches are within normal limits. Absent flow signal in the left ICA siphon until the level of the ophthalmic and posterior communicating artery origins. Reconstituted flow signal in the distal left ICA to the terminus. The left MCA and ACA origins are normal. There is only faint asymmetrically decreased flow signal in the left MCA M1 compared to the right. The left MCA bifurcation seems to be patent, but there is asymmetrically decreased flow signal in the left M2 and M3 branches compared to the right. Subtle decreased flow in the left A1 compared to the  right. The anterior communicating  artery and ACA A2 segments are within normal limits. Antegrade flow in the right ICA siphon to the terminus without stenosis. Normal right posterior communicating artery origin. Normal right MCA and ACA origins. The right M1 and MCA bifurcation are patent. Visible MCA branches are within normal limits. IMPRESSION: 1. 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 No reconstituted flow until just before the left ICA terminus, which appears supplied by the left ophthalmic and posterior communicating arteries. 2. The left MCA and ACA remain patent although with varying degrees of asymmetrically decreased flow signal compared to the right side vessels. 3. Cervical right ICA atherosclerosis without significant stenosis. 4. No posterior circulation abnormality identified to correspond with the small right cerebellar infarcts seen on MRI earlier today. Electronically Signed: By: Genevie Ann M.D. On: 04/15/2019 15:14   Mr Angio Neck Wo Contrast  Addendum Date: 04/15/2019   ADDENDUM REPORT: 04/15/2019 15:35 ADDENDUM: Study discussed by telephone with Dr. Karmen Bongo on 04/15/2019 at 1519 hours. Electronically Signed   By: Genevie Ann M.D.   On: 04/15/2019 15:35   Result Date: 04/15/2019 CLINICAL DATA:  85 year old male with dizziness found to have multiple small acute infarcts in the right cerebellum on MRI earlier today. EXAM: MRA HEAD WITHOUT CONTRAST MRA NECK WITHOUT CONTRAST TECHNIQUE: Angiographic images of the Circle of Willis were obtained using MRA technique without intravenous contrast. Angiographic images of the neck were obtained using MRA technique without intravenous contrast. Carotid stenosis measurements (when applicable) are obtained utilizing NASCET criteria, using the distal internal carotid diameter as the denominator. COMPARISON:  Brain MRI earlier today. FINDINGS: MRA NECK FINDINGS Time-of-flight images demonstrate a 3 vessel arch configuration  with occlusion of the left common carotid artery at its origin (series 4, image 148). Antegrade flow in the right CCA and both subclavian arteries. Antegrade flow continues in the right carotid through the right carotid bifurcation and in the ICA to the skull base. Mild irregularity occurs at the right ICA origin and bulb with less than 50 % stenosis with respect to the distal vessel. Absent antegrade flow throughout the left carotid to the skull base. No proximal right subclavian artery or right vertebral artery origin stenosis suspected. The right vertebral is patent to the skull base without stenosis. No proximal left subclavian or left vertebral artery origin stenosis. The left vertebral is patent with antegrade flow to the skull base. Grossly negative visible neck and upper chest. MRA HEAD FINDINGS No intracranial mass effect or ventriculomegaly. Antegrade flow in the posterior circulation. The distal vertebral arteries appear codominant and are patent to the vertebrobasilar junction. Normal right PICA origin. The left PICA has a proximal origin which is patent. Patent vertebrobasilar junction and basilar artery. Patent SCA and PCA origins with prominent bilateral posterior communicating arteries. The right PCA has a fetal type origin. Bilateral PCA branches are within normal limits. Absent flow signal in the left ICA siphon until the level of the ophthalmic and posterior communicating artery origins. Reconstituted flow signal in the distal left ICA to the terminus. The left MCA and ACA origins are normal. There is only faint asymmetrically decreased flow signal in the left MCA M1 compared to the right. The left MCA bifurcation seems to be patent, but there is asymmetrically decreased flow signal in the left M2 and M3 branches compared to the right. Subtle decreased flow in the left A1 compared to the right. The anterior communicating artery and ACA A2 segments are within normal limits. Antegrade  flow in the  right ICA siphon to the terminus without stenosis. Normal right posterior communicating artery origin. Normal right MCA and ACA origins. The right M1 and MCA bifurcation are patent. Visible MCA branches are within normal limits. IMPRESSION: 1. 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 No reconstituted flow until just before the left ICA terminus, which appears supplied by the left ophthalmic and posterior communicating arteries. 2. The left MCA and ACA remain patent although with varying degrees of asymmetrically decreased flow signal compared to the right side vessels. 3. Cervical right ICA atherosclerosis without significant stenosis. 4. No posterior circulation abnormality identified to correspond with the small right cerebellar infarcts seen on MRI earlier today. Electronically Signed: By: Genevie Ann M.D. On: 04/15/2019 15:14   Mr Brain Wo Contrast  Result Date: 04/15/2019 CLINICAL DATA:  Dizziness for 3-4 days. EXAM: MRI HEAD WITHOUT CONTRAST TECHNIQUE: Multiplanar, multiecho pulse sequences of the brain and surrounding structures were obtained without intravenous contrast. COMPARISON:  None. FINDINGS: BRAIN: There are multiple small acute infarcts of the right cerebellar hemisphere. No acute ischemia elsewhere in the brain. Multifocal white matter hyperintensity, most commonly due to chronic ischemic microangiopathy. There is generalized atrophy without lobar predilection. The midline structures are normal. There is an old left cerebellar infarct. VASCULAR: The major intracranial arterial and venous sinus flow voids are normal. Single focus of chronic microhemorrhage in the left hemisphere no acute hemorrhage. SKULL AND UPPER CERVICAL SPINE: Calvarial bone marrow signal is normal. There is no skull base mass. The visualized upper cervical spine and soft tissues are normal. SINUSES/ORBITS: There are no fluid levels or advanced mucosal thickening. The mastoid air cells and middle  ear cavities are free of fluid. The orbits are normal. IMPRESSION: 1. 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 2. No hemorrhage or mass effect. 3. Atrophy and chronic microvascular ischemia. Electronically Signed   By: Ulyses Jarred M.D.   On: 04/15/2019 04:48      Phillips Climes M.D on 04/16/2019 at 2:55 PM  Between 7am to 7pm - Pager - [phone]  After 7pm go to [email or web address] - password [facility]  [facility] -  Office  [phone]

## 2019-04-16 NOTE — Progress Notes (Signed)
  Echocardiogram 2D Echocardiogram has been performed.  Kenneth Bennett 04/16/2019, 2:01 PM

## 2019-04-16 NOTE — Progress Notes (Signed)
OT Cancellation Note  Patient Details Name: Kenneth Bennett MRN: [id number] DOB: 07-24-1934   Cancelled Treatment:    Reason Eval/Treat Not Completed: Fatigue/lethargy limiting ability to participate.  Pt lethargic and having difficulty maintaining arousal.  Will reattempt.   Lucille Passy, OTR/L Acute Rehabilitation Services Pager [phone] Office [phone]   Lucille Passy M 04/16/2019, 12:24 PM

## 2019-04-17 DIAGNOSIS — I10 Essential (primary) hypertension: Secondary | ICD-10-CM

## 2019-04-17 LAB — HEMOGLOBIN A1C
Hgb A1c MFr Bld: 6 % — ABNORMAL HIGH (ref 4.8–5.6)
Mean Plasma Glucose: 126 mg/dL

## 2019-04-17 LAB — GLUCOSE, CAPILLARY
Glucose-Capillary: 92 mg/dL (ref 70–99)
Glucose-Capillary: 92 mg/dL (ref 70–99)
Glucose-Capillary: 88 mg/dL (ref 70–99)
Glucose-Capillary: 101 mg/dL — ABNORMAL HIGH (ref 70–99)
Glucose-Capillary: 131 mg/dL — ABNORMAL HIGH (ref 70–99)
Glucose-Capillary: 91 mg/dL (ref 70–99)

## 2019-04-17 LAB — CBC
HCT: 35.6 % — ABNORMAL LOW (ref 39.0–52.0)
Hemoglobin: 11.6 g/dL — ABNORMAL LOW (ref 13.0–17.0)
MCH: 28.9 pg (ref 26.0–34.0)
MCHC: 32.6 g/dL (ref 30.0–36.0)
MCV: 88.6 fL (ref 80.0–100.0)
Platelets: 250 10*3/uL (ref 150–400)
RBC: 4.02 MIL/uL — ABNORMAL LOW (ref 4.22–5.81)
RDW: 15.6 % — ABNORMAL HIGH (ref 11.5–15.5)
WBC: 7.4 10*3/uL (ref 4.0–10.5)
nRBC: 0 % (ref 0.0–0.2)

## 2019-04-17 LAB — BASIC METABOLIC PANEL
Anion gap: 11 (ref 5–15)
BUN: 30 mg/dL — ABNORMAL HIGH (ref 8–23)
CO2: 17 mmol/L — ABNORMAL LOW (ref 22–32)
Calcium: 9.2 mg/dL (ref 8.9–10.3)
Chloride: 112 mmol/L — ABNORMAL HIGH (ref 98–111)
Creatinine, Ser: 2.21 mg/dL — ABNORMAL HIGH (ref 0.61–1.24)
GFR calc Af Amer: 31 mL/min — ABNORMAL LOW (ref >60)
GFR calc non Af Amer: 27 mL/min — ABNORMAL LOW (ref >60)
Glucose, Bld: 96 mg/dL (ref 70–99)
Potassium: 4.5 mmol/L (ref 3.5–5.1)
Sodium: 140 mmol/L (ref 135–145)

## 2019-04-17 MED ORDER — ENSURE ENLIVE PO LIQD
237.0000 mL | Freq: Two times a day (BID) | ORAL | Status: DC
  Administered 2019-04-17 – 2019-04-19 (×4): 237 mL via ORAL

## 2019-04-17 NOTE — Evaluation (Signed)
Occupational Therapy Evaluation Patient Details Name: Kenneth Bennett MRN: [id number] DOB: [birth year] Today's Date: 04/17/2019    History of Present Illness Kenneth Bennett is a 85 y.o. male with medical history significant of HTN and DM presenting with dizziness.  He reports he was "dizzy-headed."   Symptoms went away but returned next day. MRI showed multiple small cerebellar infarcts   Clinical Impression   Pt admitted with above diagnoses, cognitive deficits, decreased activity tolerance, and generalized weakness limiting ability to engage in BADL at desired level of ind. Per chart review, pt with increasing confusion and decline in functional ability from right away at admission. History taken from chart review, PTA pt was ind and living with wife. At time of eval, he is min A for bed mobility and safety (often impulsive and unaware of deficits). He appears to have some visual deficits described below. He was "stepping over" different color flat objects in the hallway, possible depth perception issues at play. He struggled with topographical orientation and finding his room without one step consistent cueing. Given current level of function, recommend SNF at d/c for safe facilitation of BADL prior to return home. Will continue to follow per POC listed below.     Follow Up Recommendations  SNF;Supervision/Assistance - 24 hour    Equipment Recommendations  Other (comment)(defer to next venue)    Recommendations for Other Services       Precautions / Restrictions Precautions Precautions: Fall Restrictions Weight Bearing Restrictions: No      Mobility Bed Mobility Overal bed mobility: Needs Assistance Bed Mobility: Sit to Supine;Supine to Sit     Supine to sit: Min assist Sit to supine: Min assist      Transfers Overall transfer level: Needs assistance Equipment used: 2 person hand held assist Transfers: Sit to/from Stand Sit to Stand: Min assist         General  transfer comment: min A for safety with RW to maintain balance safely    Balance Overall balance assessment: Needs assistance Sitting-balance support: Feet supported;Bilateral upper extremity supported Sitting balance-Leahy Scale: Fair     Standing balance support: Bilateral upper extremity supported;During functional activity Standing balance-Leahy Scale: Fair Standing balance comment: Reliant on external support                           ADL either performed or assessed with clinical judgement   ADL Overall ADL's : Needs assistance/impaired Eating/Feeding: Set up;Sitting;Cueing for safety   Grooming: Set up;Sitting;Cueing for safety   Upper Body Bathing: Sitting;Min guard;Cueing for safety   Lower Body Bathing: Min guard;Sit to/from stand;Sitting/lateral leans;Cueing for safety   Upper Body Dressing : Min guard;Sitting;Cueing for safety   Lower Body Dressing: Min guard;Sitting/lateral leans;Sit to/from stand;Cueing for safety Lower Body Dressing Details (indicate cue type and reason): donned socks sitting EOB Toilet Transfer: Minimal assistance;Regular Toilet;RW;Cueing for safety Toilet Transfer Details (indicate cue type and reason): min A for safety, impulsive and can lose balance easily Toileting- Clothing Manipulation and Hygiene: Min guard;Sit to/from stand;Sitting/lateral lean   Tub/ Shower Transfer: Minimal assistance;Ambulation;Shower seat;3 in English as a second language teacher   Functional mobility during ADLs: Minimal assistance;Cueing for safety General ADL Comments: pt overall ltd from cognitive deficits/impulsivity with generalized weakness and poor activity tol     Vision Baseline Vision/History: No visual deficits Vision Assessment?: Yes Tracking/Visual Pursuits: Left eye does not track laterally;Right eye does not track medially;Decreased smoothness of eye movement to LEFT superior field;Decreased smoothness of  eye movement to LEFT inferior field;Requires cues,  head turns, or add eye shifts to track Convergence: Other (comment)(no convergence noted, cont to assess given cog) Additional Comments: difficulty tracking to L side without cueing, remains inconsistent. When asked to turn to the L, pt starts to turn to the L and turns around to the R. He is also seen stepping over different color spots on the floor even though they are level     Perception     Praxis      Pertinent Vitals/Pain Pain Assessment: No/denies pain Faces Pain Scale: No hurt     Hand Dominance Right   Extremity/Trunk Assessment Upper Extremity Assessment Upper Extremity Assessment: Generalized weakness   Lower Extremity Assessment Lower Extremity Assessment: Defer to PT evaluation       Communication Communication Communication: No difficulties   Cognition Arousal/Alertness: Awake/alert Behavior During Therapy: Impulsive;Flat affect Overall Cognitive Status: Impaired/Different from baseline Area of Impairment: Awareness;Orientation;Attention;Problem solving;Following commands;Safety/judgement;Memory                 Orientation Level: Disoriented to;Place;Time;Situation(stated he was 85 years old) Current Attention Level: Focused Memory: Decreased short-term memory;Decreased recall of precautions Following Commands: Follows one step commands inconsistently;Follows one step commands with increased time Safety/Judgement: Decreased awareness of safety;Decreased awareness of deficits Awareness: Intellectual Problem Solving: Slow processing;Decreased initiation;Difficulty sequencing;Requires verbal cues;Requires tactile cues General Comments: decreased awareness of deficits, difficulty with topographical orientation- walking into other people's rooms   General Comments       Exercises Other Exercises Other Exercises: assisted LAQ, marching on left LE x 10 reps. Requires verbal and tactile cues to perform.   Shoulder Instructions      Home Living  Family/patient expects to be discharged to:: Private residence Living Arrangements: Spouse/significant other Available Help at Discharge: Family Type of Home: House Home Access: Level entry     Home Layout: One level               Home Equipment: None      Lives With: Spouse;Family(grandson and granddaughter)    Prior Functioning/Environment Level of Independence: Independent        Comments: pt works in yard most of day, does not like being inside        OT Problem List: Decreased strength;Decreased knowledge of use of DME or AE;Decreased activity tolerance;Decreased cognition;Impaired balance (sitting and/or standing);Decreased safety awareness      OT Treatment/Interventions: Self-care/ADL training;Therapeutic exercise;Patient/family education;Neuromuscular education;Balance training;Visual/perceptual remediation/compensation;Energy conservation;Therapeutic activities;DME and/or AE instruction;Cognitive remediation/compensation    OT Goals(Current goals can be found in the care plan section) Acute Rehab OT Goals Patient Stated Goal: none stated OT Goal Formulation: With patient Time For Goal Achievement: 05/01/19 Potential to Achieve Goals: Good  OT Frequency: Min 2X/week   Barriers to D/C:            Co-evaluation PT/OT/SLP Co-Evaluation/Treatment: Yes Reason for Co-Treatment: For patient/therapist safety;To address functional/ADL transfers;Necessary to address cognition/behavior during functional activity PT goals addressed during session: Mobility/safety with mobility;Balance;Proper use of DME OT goals addressed during session: ADL's and self-care      AM-PAC OT "6 Clicks" Daily Activity     Outcome Measure Help from another person eating meals?: None Help from another person taking care of personal grooming?: A Little Help from another person toileting, which includes using toliet, bedpan, or urinal?: A Little Help from another person bathing  (including washing, rinsing, drying)?: A Little Help from another person to put on and taking off regular upper body clothing?:  A Little Help from another person to put on and taking off regular lower body clothing?: A Little 6 Click Score: 19   End of Session Equipment Utilized During Treatment: Gait belt;Rolling walker Nurse Communication: Mobility status  Activity Tolerance: Patient tolerated treatment well Patient left: in bed;with call bell/phone within reach;with bed alarm set;with restraints reapplied  OT Visit Diagnosis: Other abnormalities of gait and mobility (R26.89);Muscle weakness (generalized) (M62.81);Other symptoms and signs involving cognitive function;Unsteadiness on feet (R26.81)                Time: [id number] OT Time Calculation (min): 24 min Charges:  OT General Charges $OT Visit: 1 Visit OT Evaluation $OT Eval Moderate Complexity: 1 Mod  Zenovia Jarred, MSOT, OTR/L Behavioral Health OT/ Acute Relief OT [facility] Office: [place] 04/17/2019, 1:38 PM

## 2019-04-17 NOTE — Evaluation (Signed)
Speech Language Pathology Evaluation Patient Details Name: Kenneth Bennett MRN: [id number] DOB: June 02, 1934 Today's Date: 04/17/2019 Time: [id number] SLP Time Calculation (min) (ACUTE ONLY): 27 min  Problem List:  Patient Active Problem List   Diagnosis Date Noted  . Acute CVA (cerebrovascular accident) ([place]) 04/15/2019  . Essential hypertension 04/15/2019  . Diabetes with neurologic complications ([place]) [id number]  . CKD (chronic kidney disease) stage 4, GFR 15-29 ml/min (HCC) 04/15/2019  . CVA (cerebral vascular accident) [facility])    Past Medical History:  Past Medical History:  Diagnosis Date  . Cataracts, both eyes    Hx: of  . CKD (chronic kidney disease) stage 4, GFR 15-29 ml/min (HCC) 04/15/2019  . CVA (cerebral vascular accident) ([place])   . Diabetes mellitus without complication ([place])    denies history  . Hypertension    not taking medicine recently   Past Surgical History:  Past Surgical History:  Procedure Laterality Date  . CATARACT EXTRACTION W/PHACO Left 11/16/2012   Procedure: CATARACT EXTRACTION PHACO AND INTRAOCULAR LENS PLACEMENT (IOC);  Surgeon: Adonis Brook, MD;  Location: [place];  Service: Ophthalmology;  Laterality: Left;  . COLONOSCOPY     Hx: of  . HERNIA REPAIR     HPI:  85 y.o. male with medical history significant of HTN and DM presenting with dizziness.  He reports he was "dizzy-headed."  Symptoms started Thursday.  He moved the grass and suddenly got dizzy and felt like he was going to fall.   MRI done in the ED showing acute multiple small cerebellar infarcts.  Neuro exam nonfocal.   Assessment / Plan / Recommendation Clinical Impression  Pt presents with mild anomic aphasia.  Language assessment was completed using the Western Aphasia Battery - Bedside Form (see below for additional information).  There was some word finding difficulty with confrontational naming task.  Pt exhibited occasional semantic paraphasias ("elbow" for "shoulder" where elbow  response was already primed; "string" for "shoelace").  Pt benefited from phonemic cues.  Pt exhibited some circumlocution ability independently.  Reading and writing were informally assessed.  Pt was able to write name, but could not write address.  Dictation attempted with pt completing partial transcription.  Pt reports that he does not write as part of day to day and that his daughter does most of his writing for him.  Pt states he does read some.  Pt was unable to read paragraph.  This may be at least in part because visual impairments.  Pt wears glasses but they were not present in room.  Pt stated his eyes were watering and he could not see.  Pt reports that his wife manages his medications, prepares meals, and manages their finances.  No dysarthria was noted in conversation.  Pt was 100% intelligible during assessment.  Pt does endorse some changes to thinking and memory and RN reports confusion, although pt was able to participate without obvious confusion during today's assessment.  Pt exhibited good attention to tasks during assessment.  SLP to follow to address anomia and complete further assessment of higher level cognitive linguistic function.   WESTERN APHASIA BATTERY - BEDSIDE FORM Spontaneous Speech - Content: 10/10 Spontaneous Speech - Fluency: 10/10 Yes/No Questions: 9/10 (10/10 with visual cue) Sequential Commands: 8/10 Repetition: 10/10 Object Naming: 8.5/10 Bedside Aphasia Score: 92.5    SLP Assessment  SLP Recommendation/Assessment: Patient needs continued Speech Lanaguage Pathology Services SLP Visit Diagnosis: Aphasia (R47.01)    Follow Up Recommendations  (Continue ST at next level of care)  Frequency and Duration min 2x/week  2 weeks      SLP Evaluation Cognition  Arousal/Alertness: Awake/alert Orientation Level: Oriented to person;Oriented to place;Oriented to situation       Comprehension  Auditory Comprehension Overall Auditory Comprehension: Appears  within functional limits for tasks assessed Yes/No Questions: Within Functional Limits Commands: Within Functional Limits Conversation: Simple Reading Comprehension Reading Status: Unable to assess (comment)(Pt does not have glasses present.)    Expression Expression Primary Mode of Expression: Verbal Verbal Expression Overall Verbal Expression: Appears within functional limits for tasks assessed Initiation: No impairment Repetition: No impairment Naming: Impairment Responsive: 76-100% accurate Confrontation: Impaired Verbal Errors: Semantic paraphasias;Aware of errors Pragmatics: No impairment Effective Techniques: Phonemic cues Written Expression Dominant Hand: Right Written Expression: Exceptions to [facility] Dictation Ability: Sentence Self Formulation Ability: Phrase   Oral / Motor  Oral Motor/Sensory Function Overall Oral Motor/Sensory Function: Within functional limits Motor Speech Respiration: Within functional limits Phonation: Normal Resonance: Within functional limits Articulation: Within functional limitis Intelligibility: Intelligible Motor Planning: Witnin functional limits Motor Speech Errors: Not applicable   [place], [place], [place] Office: [phone]; Pager (9/14): [phone] 04/17/2019, 11:44 AM

## 2019-04-17 NOTE — Progress Notes (Signed)
Initial Nutrition Assessment   RD working remotely.  DOCUMENTATION CODES:   Obesity unspecified  INTERVENTION:  Provide Ensure Enlive po BID, each supplement provides 350 kcal and 20 grams of protein  Encourage adequate PO intake.   NUTRITION DIAGNOSIS:   Increased nutrient needs related to acute illness as evidenced by estimated needs.  GOAL:   Patient will meet greater than or equal to 90% of their needs  MONITOR:   PO intake, Supplement acceptance, Skin, Weight trends, Labs, I & O's  REASON FOR ASSESSMENT:   Consult (Stroke)  ASSESSMENT:   85 y.o. male with medical history significant of HTN, CKD4, and DM presenting with dizziness. MRI done in the ED showing acute multiple small cerebellar infarcts.  Pt unavailable during attempted time of contact. RD unable to obtain pt nutrition history at this time. RD to order nutritional supplements to aid in caloric and protein needs.   Unable to complete Nutrition-Focused physical exam at this time.   Labs and medications reviewed.   Diet Order:   Diet Order            Diet heart healthy/carb modified Room service appropriate? No; Fluid consistency: Thin  Diet effective now              EDUCATION NEEDS:   Not appropriate for education at this time  Skin:  Skin Assessment: Reviewed RN Assessment  Last BM:  9/12  Height:   Ht Readings from Last 1 Encounters:  04/14/19 5\' 6"  (1.676 m)    Weight:   Wt Readings from Last 1 Encounters:  04/14/19 87.5 kg    Ideal Body Weight:  64.54 kg  BMI:  Body mass index is 31.14 kg/m.  Estimated Nutritional Needs:   Kcal:  1800-1950  Protein:  85-95 grams  Fluid:  >/= 1.8 L/day    Corrin Parker, MS, RD, LDN Pager # [phone] After hours/ weekend pager # [phone]

## 2019-04-17 NOTE — TOC Initial Note (Signed)
Transition of Care [facility]) - Initial/Assessment Note    Patient Details  Name: Kenneth Bennett MRN: [id number] Date of Birth: 1934/04/18  Transition of [facility]) CM/SW Contact:    Pollie Friar, RN Phone Number: 04/17/2019, 3:14 PM  Clinical Narrative:                 CM spoke to patients wife over the phone about d/c planning and therapy recommendations. Wife is in agreement with SNF rehab prior to home. She asked that he be faxed out in the [facility] area to all SNF except [facility]. FL2 completed and patient faxed out. Will provide wife bed offers when available.  TOC following.  Expected Discharge Plan: Skilled Nursing Facility Barriers to Discharge: Continued Medical Work up   Patient Goals and CMS Choice   CMS Medicare.gov Compare Post Acute Care list provided to:: Patient Represenative (must comment) Choice offered to / list presented to : Spouse  Expected Discharge Plan and Services Expected Discharge Plan: [place] In-house Referral: Clinical Social Work Discharge Planning Services: CM Consult Post Acute Care Choice: [place] Living arrangements for the past 2 months: [place]                                      Prior Living Arrangements/Services Living arrangements for the past 2 months: Single Family Home Lives with:: Spouse Patient language and need for interpreter reviewed:: Yes(no needs) Do you feel safe going back to the place where you live?: Yes      Need for Family Participation in Patient Care: Yes (Comment)(24 hour supervision) Care giver support system in place?: Yes (comment)(but not 24 hour)   Criminal Activity/Legal Involvement Pertinent to Current Situation/Hospitalization: No - Comment as needed  Activities of Daily Living Home Assistive Devices/Equipment: None ADL Screening (condition at time of admission) Patient's cognitive ability adequate to safely complete daily activities?:  No Is the patient deaf or have difficulty hearing?: No Does the patient have difficulty seeing, even when wearing glasses/contacts?: No Does the patient have difficulty concentrating, remembering, or making decisions?: Yes Patient able to express need for assistance with ADLs?: Yes Does the patient have difficulty dressing or bathing?: Yes Independently performs ADLs?: No Communication: Needs assistance Is this a change from baseline?: Change from baseline, expected to last >3 days Dressing (OT): Needs assistance Is this a change from baseline?: Change from baseline, expected to last >3 days Grooming: Needs assistance Is this a change from baseline?: Change from baseline, expected to last >3 days Feeding: Needs assistance Is this a change from baseline?: Change from baseline, expected to last >3 days Bathing: Needs assistance Is this a change from baseline?: Change from baseline, expected to last >3 days Toileting: Needs assistance Is this a change from baseline?: Change from baseline, expected to last >3days In/Out Bed: Dependent Is this a change from baseline?: Change from baseline, expected to last >3 days Walks in Home: Independent Does the patient have difficulty walking or climbing stairs?: No Weakness of Legs: Both Weakness of Arms/Hands: Both  Permission Sought/Granted                  Emotional Assessment Appearance:: Appears stated age     Orientation: : Oriented to Self, Oriented to Place, Oriented to Situation   Psych Involvement: No (comment)  Admission diagnosis:  Acute CVA (cerebrovascular accident) [facility]) [I63.9] Patient Active  Problem List   Diagnosis Date Noted  . Acute CVA (cerebrovascular accident) ([place]) 04/15/2019  . Essential hypertension 04/15/2019  . Diabetes with neurologic complications ([place]) [id number]  . CKD (chronic kidney disease) stage 4, GFR 15-29 ml/min (HCC) 04/15/2019  . CVA (cerebral vascular accident) ([place])    PCP:  Lucianne Lei,  MD Pharmacy:   [id number] [place], [place] [id number] [place] [id number] Phone: [phone] Fax: [phone]     Social Determinants of Health (SDOH) Interventions    Readmission Risk Interventions No flowsheet data found.

## 2019-04-17 NOTE — NC FL2 (Signed)
[place] LEVEL OF CARE SCREENING TOOL     IDENTIFICATION  Patient Name: Kenneth Bennett Birthdate: 12-Nov-1933 Sex: male Admission Date (Current Location): 04/14/2019  [facility] and [place] Number:  Herbalist and Address:  [facility]. [place], [place] [address] [place],  [address]      Provider Number: [id number]  Attending Physician Name and Address:  Elgergawy, Silver Huguenin, MD  Relative Name and Phone Number:       Current Level of Care: Hospital Recommended Level of Care: [place] Prior Approval Number:    Date Approved/Denied:   PASRR Number:  [id number]  Discharge Plan: Home    Current Diagnoses: Patient Active Problem List   Diagnosis Date Noted  . Acute CVA (cerebrovascular accident) ([place]) 04/15/2019  . Essential hypertension 04/15/2019  . Diabetes with neurologic complications ([place]) [id number]  . CKD (chronic kidney disease) stage 4, GFR 15-29 ml/min (HCC) 04/15/2019  . CVA (cerebral vascular accident) ([place])     Orientation RESPIRATION BLADDER Height & Weight     Self, Situation, Place  Normal Continent Weight: 87.5 kg Height:  5\' 6"  (167.6 cm)  BEHAVIORAL SYMPTOMS/MOOD NEUROLOGICAL BOWEL NUTRITION STATUS      Continent Diet(heart healthy/ carb modified)  AMBULATORY STATUS COMMUNICATION OF NEEDS Skin   Limited Assist Verbally Normal                       Personal Care Assistance Level of Assistance  Bathing, Feeding, Dressing Bathing Assistance: Limited assistance Feeding assistance: Limited assistance Dressing Assistance: Limited assistance     Functional Limitations Info  Sight, Hearing, Speech Sight Info: Adequate Hearing Info: Adequate Speech Info: Adequate    SPECIAL CARE FACTORS FREQUENCY  PT (By licensed PT), OT (By licensed OT), Speech therapy     PT Frequency: 5x/wk OT Frequency: 5x/wk     Speech Therapy Frequency: 5x/wk      Contractures Contractures Info: Not  present    Additional Factors Info  Code Status, Allergies, Insulin Sliding Scale Code Status Info: Full Allergies Info: NKA   Insulin Sliding Scale Info: Novolog 0-15 units SQ three times a day       Current Medications (04/17/2019):  This is the current hospital active medication list Current Facility-Administered Medications  Medication Dose Route Frequency Provider Last Rate Last Dose  . 0.9 %  sodium chloride infusion   Intravenous Continuous Karmen Bongo, MD 50 mL/hr at 04/15/19 2032    . acetaminophen (TYLENOL) tablet 650 mg  650 mg Oral Q4H PRN Karmen Bongo, MD       Or  . acetaminophen (TYLENOL) solution 650 mg  650 mg Per Tube Q4H PRN Karmen Bongo, MD       Or  . acetaminophen (TYLENOL) suppository 650 mg  650 mg Rectal Q4H PRN Karmen Bongo, MD      . aspirin EC tablet 81 mg  81 mg Oral Daily Rinehuls, David L, PA-C   81 mg at 04/17/19 0839  . atorvastatin (LIPITOR) tablet 40 mg  40 mg Oral q1800 Karmen Bongo, MD   40 mg at 04/16/19 1716  . clopidogrel (PLAVIX) tablet 75 mg  75 mg Oral Daily Rinehuls, David L, PA-C   75 mg at 04/17/19 0839  . enoxaparin (LOVENOX) injection 30 mg  30 mg Subcutaneous Q24H Elgergawy, Silver Huguenin, MD   30 mg at 04/16/19 1716  . feeding supplement (ENSURE ENLIVE) (ENSURE ENLIVE) liquid 237 mL  237 mL  Oral BID BM Elgergawy, Silver Huguenin, MD   237 mL at 04/17/19 1454  . insulin aspart (novoLOG) injection 0-15 Units  0-15 Units Subcutaneous TID WC Karmen Bongo, MD      . senna-docusate (Senokot-S) tablet 1 tablet  1 tablet Oral QHS PRN Karmen Bongo, MD         Discharge Medications: Please see discharge summary for a list of discharge medications.  Relevant Imaging Results:  Relevant Lab Results:   Additional Information SS#: [id number]  Pollie Friar, RN

## 2019-04-17 NOTE — Progress Notes (Signed)
Physical Therapy Treatment Patient Details Name: Kenneth Bennett MRN: [id number] DOB: 1934/06/21 Today's Date: 04/17/2019    History of Present Illness Kenneth Bennett is a 85 y.o. male with medical history significant of HTN and DM presenting with dizziness.  He reports he was "dizzy-headed."   Symptoms went away but returned next day. MRI showed multiple small cerebellar infarcts    PT Comments    Patient received in bed, restraints applied to B UE. Patient alert, confused. Pleasant. Requires min assist with bed mobility, min assist with sit to stand. Ambulated with +2 min/mod assist 150' using RW. Difficulty navigating due to left side deficits, confusion. Wanting to turn into other patient's rooms. Requiring multimodal cues for direction and safety. Patient has had change in function since evaluation requiring increased assistance. Patient will benefit from continued acute skilled PT to improve functional mobility and safety.         Follow Up Recommendations  SNF     Equipment Recommendations  Rolling walker with 5" wheels    Recommendations for Other Services       Precautions / Restrictions Precautions Precautions: Fall Restrictions Weight Bearing Restrictions: No    Mobility  Bed Mobility Overal bed mobility: Needs Assistance Bed Mobility: Sit to Supine;Supine to Sit     Supine to sit: Min assist Sit to supine: Min assist      Transfers Overall transfer level: Needs assistance Equipment used: 2 person hand held assist Transfers: Sit to/from Stand Sit to Stand: Min assist         General transfer comment: unsteady with initial standing balance.  Ambulation/Gait Ambulation/Gait assistance: Min assist Gait Distance (Feet): 150 Feet Assistive device: 2 person hand held assist;Rolling walker (2 wheeled) Gait Pattern/deviations: Step-to pattern;Decreased stride length;Decreased step length - right;Decreased step length - left;Narrow base of support Gait  velocity: decreased   General Gait Details: Patient initially unsteady with standing, therefore got RW, patient initially picking up walker, required cues for effective use. Trying to turn into every room on the hall. Left side weakness, neglect.   Stairs             Wheelchair Mobility    Modified Rankin (Stroke Patients Only) Modified Rankin (Stroke Patients Only) Pre-Morbid Rankin Score: No symptoms Modified Rankin: Moderate disability     Balance Overall balance assessment: Needs assistance Sitting-balance support: Feet supported;Bilateral upper extremity supported Sitting balance-Leahy Scale: Fair     Standing balance support: Bilateral upper extremity supported;During functional activity Standing balance-Leahy Scale: Fair Standing balance comment: Reliant on external support                            Cognition Arousal/Alertness: Awake/alert Behavior During Therapy: Impulsive;Flat affect Overall Cognitive Status: Impaired/Different from baseline Area of Impairment: Awareness;Orientation;Attention;Problem solving;Following commands;Safety/judgement;Memory                 Orientation Level: Disoriented to;Place;Time;Situation   Memory: Decreased short-term memory;Decreased recall of precautions Following Commands: Follows one step commands inconsistently;Follows one step commands with increased time Safety/Judgement: Decreased awareness of safety;Decreased awareness of deficits   Problem Solving: Slow processing;Decreased initiation;Difficulty sequencing;Requires verbal cues;Requires tactile cues        Exercises Other Exercises Other Exercises: assisted LAQ, marching on left LE x 10 reps. Requires verbal and tactile cues to perform.    General Comments        Pertinent Vitals/Pain Pain Assessment: No/denies pain Faces Pain Scale: No hurt  Home Living Family/patient expects to be discharged to:: Skilled nursing facility    Available Help at Discharge: Family Type of Home: House              Prior Function            PT Goals (current goals can now be found in the care plan section) Acute Rehab PT Goals Patient Stated Goal: none stated Progress towards PT goals: Goals downgraded-see care plan;Not progressing toward goals - comment(patient with increased deficits since evaluation)    Frequency    Min 4X/week      PT Plan Discharge plan needs to be updated    Co-evaluation PT/OT/SLP Co-Evaluation/Treatment: Yes Reason for Co-Treatment: For patient/therapist safety;To address functional/ADL transfers;Complexity of the patient's impairments (multi-system involvement) PT goals addressed during session: Mobility/safety with mobility;Balance;Proper use of DME        AM-PAC PT "6 Clicks" Mobility   Outcome Measure  Help needed turning from your back to your side while in a flat bed without using bedrails?: None Help needed moving from lying on your back to sitting on the side of a flat bed without using bedrails?: A Little Help needed moving to and from a bed to a chair (including a wheelchair)?: A Lot Help needed standing up from a chair using your arms (e.g., wheelchair or bedside chair)?: A Lot Help needed to walk in hospital room?: A Lot Help needed climbing 3-5 steps with a railing? : A Lot 6 Click Score: 15    End of Session Equipment Utilized During Treatment: Gait belt Activity Tolerance: Patient tolerated treatment well Patient left: in bed;with bed alarm set;with restraints reapplied Nurse Communication: Mobility status PT Visit Diagnosis: Unsteadiness on feet (R26.81);Muscle weakness (generalized) (M62.81);Difficulty in walking, not elsewhere classified (R26.2);Hemiplegia and hemiparesis Hemiplegia - Right/Left: Left Hemiplegia - dominant/non-dominant: Non-dominant Hemiplegia - caused by: Cerebral infarction     Time: 1035-1059 PT Time Calculation (min) (ACUTE ONLY): 24  min  Charges:  $Gait Training: 8-22 mins                     {Stacie Templin, PT, GCS 04/17/19,12:13 PM

## 2019-04-17 NOTE — Progress Notes (Signed)
PROGRESS NOTE                                                                                                                                                                                                             Patient Demographics:    Kenneth Bennett, is a 85 y.o. male, DOB - 1934-02-24, [id number]  Admit date - 04/14/2019   Admitting Physician Shela Leff, MD  Outpatient Primary MD for the patient is Lucianne Lei, MD  LOS - 2   Chief Complaint  Patient presents with  . Dizziness       Brief Narrative    85 y.o. male with medical history significant of HTN and DM presenting with dizziness.  He reports he was "dizzy-headed."  Symptoms started Thursday.  He moved the grass and suddenly got dizzy and felt like he was going to fall.  MRI done in the ED showing acute multiple small cerebellar infarcts. Neuro exam nonfocal.    Subjective:    [name] today was confused this morning, but not agitated or trying to get out of bed, he was on wrist restraint.   Assessment  & Plan :    Principal Problem:   Acute CVA (cerebrovascular accident) [facility]) Active Problems:   Essential hypertension   Diabetes with neurologic complications (HCC)   CKD (chronic kidney disease) stage 4, GFR 15-29 ml/min (HCC)   Acute CVA -MRI head showing multiple small acute infarcts of the right cerebellum. -MRA head and neck significant for left common carotid artery occlusion at its origin. -2 D echo with EF 50 to 55%, no evidence of embolic source. -Continue with aspirin and Plavix for 3 weeks, then continue with aspirin alone -LDL  elevated at 153 -PT/OT consulted  Acute metabolic encephalopathy -Patient is significantly confused and agitated in the hospital for last 48 hours, but his mentation has improved today, he remains significantly confused, more calm and follow commands and pleasant this morning, is most likely in the setting of acute  delirium on top of his acute CVA, for now we will try to avoid antipsychotics.  HTN -Allow permissive HTNfor now -He has not been compliant with BP medications in quite some time  HLD -LDL elevated at 154, continue with Lipitor  DM -CBG overall well controlled, his A1c is well controlled at 6  CKD stage IV -  Retaining remains at baseline around 2, avoid nephrotoxic medication     Code Status : Full  Family Communication  : Discussed with wife via phone  Disposition Plan  : Will need SNF placement  Barriers For Discharge : acute CVA work up  Consults  :  Neurology  Procedures  : None  DVT Prophylaxis  : Subcu Lovenox  Lab Results  Component Value Date   PLT 250 04/17/2019    Antibiotics  :    Anti-infectives (From admission, onward)   None        Objective:   Vitals:   04/16/19 2321 04/17/19 0423 04/17/19 0700 04/17/19 1114  BP: (!) 159/91 (!) 161/99 (!) 143/97 (!) 153/91  Pulse: 85 92 96 89  Resp: 15 18 18    Temp: 98.3 F (36.8 C) 98.3 F (36.8 C) 99.6 F (37.6 C) (!) 97.5 F (36.4 C)  TempSrc: Oral Oral Oral Oral  SpO2: 100% 100% 96% 97%  Weight:      Height:        Wt Readings from Last 3 Encounters:  04/14/19 87.5 kg  01/22/15 87.5 kg  11/16/12 84 kg    No intake or output data in the 24 hours ending 04/17/19 1347   Physical Exam  Awake Alert, Oriented X 1, speech is fluent, but he remains significantly confused. Symmetrical Chest wall movement, Good air movement bilaterally, CTAB RRR,No Gallops,Rubs or new Murmurs, No Parasternal Heave +ve B.Sounds, Abd Soft, No tenderness, No rebound - guarding or rigidity. No Cyanosis, Clubbing or edema, No new Rash or bruise       Data Review:    CBC Recent Labs  Lab 04/14/19 1759 04/17/19 0712  WBC 6.6 7.4  HGB 11.1* 11.6*  HCT 34.8* 35.6*  PLT 224 250  MCV 90.4 88.6  MCH 28.8 28.9  MCHC 31.9 32.6  RDW 16.3* 15.6*    Chemistries  Recent Labs  Lab 04/14/19 1759 04/17/19  0712  NA 139 140  K 4.9 4.5  CL 110 112*  CO2 19* 17*  GLUCOSE 134* 96  BUN 36* 30*  CREATININE 2.48* 2.21*  CALCIUM 9.4 9.2  AST 17  --   ALT 10  --   ALKPHOS 88  --   BILITOT 0.4  --    ------------------------------------------------------------------------------------------------------------------ Recent Labs    04/16/19 0359  CHOL 220*  HDL 44  LDLCALC 153*  TRIG 113  CHOLHDL 5.0    Lab Results  Component Value Date   HGBA1C 6.0 (H) 04/15/2019   ------------------------------------------------------------------------------------------------------------------ Recent Labs    04/15/19 1055  TSH 2.569   ------------------------------------------------------------------------------------------------------------------ No results for input(s): VITAMINB12, FOLATE, FERRITIN, TIBC, IRON, RETICCTPCT in the last 72 hours.  Coagulation profile No results for input(s): INR, PROTIME in the last 168 hours.  No results for input(s): DDIMER in the last 72 hours.  Cardiac Enzymes No results for input(s): CKMB, TROPONINI, MYOGLOBIN in the last 168 hours.  Invalid input(s): CK ------------------------------------------------------------------------------------------------------------------ No results found for: BNP  Inpatient Medications  Scheduled Meds: . aspirin EC  81 mg Oral Daily  . atorvastatin  40 mg Oral q1800  . clopidogrel  75 mg Oral Daily  . enoxaparin (LOVENOX) injection  30 mg Subcutaneous Q24H  . feeding supplement (ENSURE ENLIVE)  237 mL Oral BID BM  . insulin aspart  0-15 Units Subcutaneous TID WC   Continuous Infusions: . sodium chloride 50 mL/hr at 04/15/19 2032   PRN Meds:.acetaminophen **OR** acetaminophen (TYLENOL) oral liquid 160 mg/5 mL **OR**  acetaminophen, senna-docusate  Micro Results Recent Results (from the past 240 hour(s))  SARS CORONAVIRUS 2 (TAT 6-24 HRS) Nasopharyngeal Nasopharyngeal Swab     Status: None   Collection Time:  04/15/19  5:50 AM   Specimen: Nasopharyngeal Swab  Result Value Ref Range Status   SARS Coronavirus 2 NEGATIVE NEGATIVE Final    Comment: (NOTE) SARS-CoV-2 target nucleic acids are NOT DETECTED. The SARS-CoV-2 RNA is generally detectable in upper and lower respiratory specimens during the acute phase of infection. Negative results do not preclude SARS-CoV-2 infection, do not rule out co-infections with other pathogens, and should not be used as the sole basis for treatment or other patient management decisions. Negative results must be combined with clinical observations, patient history, and epidemiological information. The expected result is Negative. Fact Sheet for Patients: SugarRoll.be Fact Sheet for Healthcare Providers: https://www.woods-mathews.com/ This test is not yet approved or cleared by the [place] FDA and  has been authorized for detection and/or diagnosis of SARS-CoV-2 by FDA under an Emergency Use Authorization (EUA). This EUA will remain  in effect (meaning this test can be used) for the duration of the COVID-19 declaration under Section 56 4(b)(1) of the Act, 21 U.S.C. section 360bbb-3(b)(1), unless the authorization is terminated or revoked sooner. Performed at [facility], [place] [address] [place], [place] [address]     Radiology Reports Mr Angio Head Wo Contrast  Addendum Date: 04/15/2019   ADDENDUM REPORT: 04/15/2019 15:35 ADDENDUM: Study discussed by telephone with Dr. Karmen Bongo on 04/15/2019 at 1519 hours. Electronically Signed   By: Genevie Ann M.D.   On: 04/15/2019 15:35   Result Date: 04/15/2019 CLINICAL DATA:  85 year old male with dizziness found to have multiple small acute infarcts in the right cerebellum on MRI earlier today. EXAM: MRA HEAD WITHOUT CONTRAST MRA NECK WITHOUT CONTRAST TECHNIQUE: Angiographic images of the Circle of Willis were obtained using MRA technique without intravenous  contrast. Angiographic images of the neck were obtained using MRA technique without intravenous contrast. Carotid stenosis measurements (when applicable) are obtained utilizing NASCET criteria, using the distal internal carotid diameter as the denominator. COMPARISON:  Brain MRI earlier today. FINDINGS: MRA NECK FINDINGS Time-of-flight images demonstrate a 3 vessel arch configuration with occlusion of the left common carotid artery at its origin (series 4, image 148). Antegrade flow in the right CCA and both subclavian arteries. Antegrade flow continues in the right carotid through the right carotid bifurcation and in the ICA to the skull base. Mild irregularity occurs at the right ICA origin and bulb with less than 50 % stenosis with respect to the distal vessel. Absent antegrade flow throughout the left carotid to the skull base. No proximal right subclavian artery or right vertebral artery origin stenosis suspected. The right vertebral is patent to the skull base without stenosis. No proximal left subclavian or left vertebral artery origin stenosis. The left vertebral is patent with antegrade flow to the skull base. Grossly negative visible neck and upper chest. MRA HEAD FINDINGS No intracranial mass effect or ventriculomegaly. Antegrade flow in the posterior circulation. The distal vertebral arteries appear codominant and are patent to the vertebrobasilar junction. Normal right PICA origin. The left PICA has a proximal origin which is patent. Patent vertebrobasilar junction and basilar artery. Patent SCA and PCA origins with prominent bilateral posterior communicating arteries. The right PCA has a fetal type origin. Bilateral PCA branches are within normal limits. Absent flow signal in the left ICA siphon until the level of the ophthalmic  and posterior communicating artery origins. Reconstituted flow signal in the distal left ICA to the terminus. The left MCA and ACA origins are normal. There is only faint  asymmetrically decreased flow signal in the left MCA M1 compared to the right. The left MCA bifurcation seems to be patent, but there is asymmetrically decreased flow signal in the left M2 and M3 branches compared to the right. Subtle decreased flow in the left A1 compared to the right. The anterior communicating artery and ACA A2 segments are within normal limits. Antegrade flow in the right ICA siphon to the terminus without stenosis. Normal right posterior communicating artery origin. Normal right MCA and ACA origins. The right M1 and MCA bifurcation are patent. Visible MCA branches are within normal limits. IMPRESSION: 1. 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 No reconstituted flow until just before the left ICA terminus, which appears supplied by the left ophthalmic and posterior communicating arteries. 2. The left MCA and ACA remain patent although with varying degrees of asymmetrically decreased flow signal compared to the right side vessels. 3. Cervical right ICA atherosclerosis without significant stenosis. 4. No posterior circulation abnormality identified to correspond with the small right cerebellar infarcts seen on MRI earlier today. Electronically Signed: By: Genevie Ann M.D. On: 04/15/2019 15:14   Mr Angio Neck Wo Contrast  Addendum Date: 04/15/2019   ADDENDUM REPORT: 04/15/2019 15:35 ADDENDUM: Study discussed by telephone with Dr. Karmen Bongo on 04/15/2019 at 1519 hours. Electronically Signed   By: Genevie Ann M.D.   On: 04/15/2019 15:35   Result Date: 04/15/2019 CLINICAL DATA:  85 year old male with dizziness found to have multiple small acute infarcts in the right cerebellum on MRI earlier today. EXAM: MRA HEAD WITHOUT CONTRAST MRA NECK WITHOUT CONTRAST TECHNIQUE: Angiographic images of the Circle of Willis were obtained using MRA technique without intravenous contrast. Angiographic images of the neck were obtained using MRA technique without intravenous contrast.  Carotid stenosis measurements (when applicable) are obtained utilizing NASCET criteria, using the distal internal carotid diameter as the denominator. COMPARISON:  Brain MRI earlier today. FINDINGS: MRA NECK FINDINGS Time-of-flight images demonstrate a 3 vessel arch configuration with occlusion of the left common carotid artery at its origin (series 4, image 148). Antegrade flow in the right CCA and both subclavian arteries. Antegrade flow continues in the right carotid through the right carotid bifurcation and in the ICA to the skull base. Mild irregularity occurs at the right ICA origin and bulb with less than 50 % stenosis with respect to the distal vessel. Absent antegrade flow throughout the left carotid to the skull base. No proximal right subclavian artery or right vertebral artery origin stenosis suspected. The right vertebral is patent to the skull base without stenosis. No proximal left subclavian or left vertebral artery origin stenosis. The left vertebral is patent with antegrade flow to the skull base. Grossly negative visible neck and upper chest. MRA HEAD FINDINGS No intracranial mass effect or ventriculomegaly. Antegrade flow in the posterior circulation. The distal vertebral arteries appear codominant and are patent to the vertebrobasilar junction. Normal right PICA origin. The left PICA has a proximal origin which is patent. Patent vertebrobasilar junction and basilar artery. Patent SCA and PCA origins with prominent bilateral posterior communicating arteries. The right PCA has a fetal type origin. Bilateral PCA branches are within normal limits. Absent flow signal in the left ICA siphon until the level of the ophthalmic and posterior communicating artery origins. Reconstituted flow signal in  the distal left ICA to the terminus. The left MCA and ACA origins are normal. There is only faint asymmetrically decreased flow signal in the left MCA M1 compared to the right. The left MCA bifurcation seems  to be patent, but there is asymmetrically decreased flow signal in the left M2 and M3 branches compared to the right. Subtle decreased flow in the left A1 compared to the right. The anterior communicating artery and ACA A2 segments are within normal limits. Antegrade flow in the right ICA siphon to the terminus without stenosis. Normal right posterior communicating artery origin. Normal right MCA and ACA origins. The right M1 and MCA bifurcation are patent. Visible MCA branches are within normal limits. IMPRESSION: 1. 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 No reconstituted flow until just before the left ICA terminus, which appears supplied by the left ophthalmic and posterior communicating arteries. 2. The left MCA and ACA remain patent although with varying degrees of asymmetrically decreased flow signal compared to the right side vessels. 3. Cervical right ICA atherosclerosis without significant stenosis. 4. No posterior circulation abnormality identified to correspond with the small right cerebellar infarcts seen on MRI earlier today. Electronically Signed: By: Genevie Ann M.D. On: 04/15/2019 15:14   Mr Brain Wo Contrast  Result Date: 04/15/2019 CLINICAL DATA:  Dizziness for 3-4 days. EXAM: MRI HEAD WITHOUT CONTRAST TECHNIQUE: Multiplanar, multiecho pulse sequences of the brain and surrounding structures were obtained without intravenous contrast. COMPARISON:  None. FINDINGS: BRAIN: There are multiple small acute infarcts of the right cerebellar hemisphere. No acute ischemia elsewhere in the brain. Multifocal white matter hyperintensity, most commonly due to chronic ischemic microangiopathy. There is generalized atrophy without lobar predilection. The midline structures are normal. There is an old left cerebellar infarct. VASCULAR: The major intracranial arterial and venous sinus flow voids are normal. Single focus of chronic microhemorrhage in the left hemisphere no acute  hemorrhage. SKULL AND UPPER CERVICAL SPINE: Calvarial bone marrow signal is normal. There is no skull base mass. The visualized upper cervical spine and soft tissues are normal. SINUSES/ORBITS: There are no fluid levels or advanced mucosal thickening. The mastoid air cells and middle ear cavities are free of fluid. The orbits are normal. IMPRESSION: 1. 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 2. No hemorrhage or mass effect. 3. Atrophy and chronic microvascular ischemia. Electronically Signed   By: Ulyses Jarred M.D.   On: 04/15/2019 04:48      Phillips Climes M.D on 04/17/2019 at 1:47 PM  Between 7am to 7pm - Pager - [phone]  After 7pm go to [email or web address] - password [facility]  [facility] -  Office  [phone]

## 2019-04-18 LAB — GLUCOSE, CAPILLARY
Glucose-Capillary: 83 mg/dL (ref 70–99)
Glucose-Capillary: 110 mg/dL — ABNORMAL HIGH (ref 70–99)
Glucose-Capillary: 88 mg/dL (ref 70–99)
Glucose-Capillary: 102 mg/dL — ABNORMAL HIGH (ref 70–99)

## 2019-04-18 LAB — NOVEL CORONAVIRUS, NAA (HOSP ORDER, SEND-OUT TO REF LAB; TAT 18-24 HRS): SARS-CoV-2, NAA: NOT DETECTED

## 2019-04-18 LAB — SARS CORONAVIRUS 2 (TAT 6-24 HRS): SARS Coronavirus 2: NEGATIVE

## 2019-04-18 NOTE — Progress Notes (Signed)
Physical Therapy Treatment Patient Details Name: Kenneth Bennett MRN: [id number] DOB: Sep 28, 1933 Today's Date: 04/18/2019    History of Present Illness Kenneth Bennett is a 85 y.o. male with medical history significant of HTN and DM presenting with dizziness.  He reports he was "dizzy-headed."   Symptoms went away but returned next day. MRI showed multiple small cerebellar infarcts    PT Comments    Pt more alert and mobilizing better than past 2 days, ambulated 350' without AD and needing only min A. Worked on directional changes as well as navigation. Continuing to recommend SNF due to cognitive dysfunction and safety concerns. PT will continue to follow.     Follow Up Recommendations  SNF     Equipment Recommendations  Rolling walker with 5" wheels    Recommendations for Other Services       Precautions / Restrictions Precautions Precautions: Fall Restrictions Weight Bearing Restrictions: No    Mobility  Bed Mobility Overal bed mobility: Needs Assistance Bed Mobility: Supine to Sit     Supine to sit: Supervision     General bed mobility comments: pt able to come to EOB without assist, needed minimally increased time to wt shift from posterior to anterior to get feet to floor  Transfers Overall transfer level: Needs assistance Equipment used: 1 person hand held assist Transfers: Sit to/from Stand Sit to Stand: Min assist         General transfer comment: min A for safety   Ambulation/Gait Ambulation/Gait assistance: Min assist Gait Distance (Feet): 350 Feet Assistive device: None Gait Pattern/deviations: Decreased stride length;Decreased step length - right;Decreased step length - left;Narrow base of support;Step-through pattern;Staggering left Gait velocity: decreased Gait velocity interpretation: <1.8 ft/sec, indicate of risk for recurrent falls General Gait Details: pt much more steady today, more like eval on saturday. Occasional left stagger and mild  ataxia. Min A needed but no AD. Worked on head turns during gait as well as changes of direction and obstacle navigation. Pt not trying to go into incorrect rooms but also could not find his way back to his room without vc's   Stairs             Wheelchair Mobility    Modified Rankin (Stroke Patients Only) Modified Rankin (Stroke Patients Only) Pre-Morbid Rankin Score: No symptoms Modified Rankin: Moderately severe disability     Balance Overall balance assessment: Needs assistance Sitting-balance support: Feet supported;Bilateral upper extremity supported Sitting balance-Leahy Scale: Good     Standing balance support: During functional activity;No upper extremity supported Standing balance-Leahy Scale: Fair                 High Level Balance Comments: vertical and horizontal head turns while walking did cause dizziness for pt            Cognition Arousal/Alertness: Awake/alert Behavior During Therapy: Impulsive Overall Cognitive Status: Impaired/Different from baseline Area of Impairment: Awareness;Orientation;Attention;Problem solving;Following commands;Safety/judgement;Memory                 Orientation Level: Disoriented to;Place;Time;Situation(stated he was 85 years old) Current Attention Level: Sustained Memory: Decreased short-term memory;Decreased recall of precautions Following Commands: Follows one step commands with increased time;Follows one step commands consistently Safety/Judgement: Decreased awareness of safety;Decreased awareness of deficits Awareness: Intellectual Problem Solving: Slow processing;Decreased initiation;Difficulty sequencing;Requires verbal cues;Requires tactile cues General Comments: worked on cognitive tasks while doing ther ex, pt could count 1-20 but could count by 5's only when sitting still and could not count by  2's under any circumstances      Exercises General Exercises - Lower Extremity Long Arc Quad:  AROM;Both;10 reps;Seated Hip Flexion/Marching: AROM;Both;10 reps;Seated    General Comments General comments (skin integrity, edema, etc.): performed 1 min sit to stand test: pt score 17      Pertinent Vitals/Pain Pain Assessment: No/denies pain Faces Pain Scale: No hurt    Home Living                      Prior Function            PT Goals (current goals can now be found in the care plan section) Acute Rehab PT Goals Patient Stated Goal: get outside PT Goal Formulation: With patient Time For Goal Achievement: 04/29/19 Potential to Achieve Goals: Good Progress towards PT goals: Progressing toward goals    Frequency    Min 3X/week      PT Plan Current plan remains appropriate;Frequency needs to be updated    Co-evaluation              AM-PAC PT "6 Clicks" Mobility   Outcome Measure  Help needed turning from your back to your side while in a flat bed without using bedrails?: None Help needed moving from lying on your back to sitting on the side of a flat bed without using bedrails?: A Little Help needed moving to and from a bed to a chair (including a wheelchair)?: A Little Help needed standing up from a chair using your arms (e.g., wheelchair or bedside chair)?: A Little Help needed to walk in hospital room?: A Little Help needed climbing 3-5 steps with a railing? : A Little 6 Click Score: 19    End of Session Equipment Utilized During Treatment: Gait belt Activity Tolerance: Patient tolerated treatment well Patient left: with chair alarm set;with call bell/phone within reach;in chair;with nursing/sitter in room Nurse Communication: Mobility status PT Visit Diagnosis: Unsteadiness on feet (R26.81);Muscle weakness (generalized) (M62.81);Difficulty in walking, not elsewhere classified (R26.2);Hemiplegia and hemiparesis Hemiplegia - Right/Left: Left Hemiplegia - dominant/non-dominant: Non-dominant Hemiplegia - caused by: Cerebral infarction      Time: [id number] PT Time Calculation (min) (ACUTE ONLY): 29 min  Charges:  $Gait Training: 8-22 mins $Therapeutic Exercise: 8-22 mins                     Leighton Roach, PT  Acute Rehab Services  Pager [phone] Office [place] 04/18/2019, 10:37 AM

## 2019-04-18 NOTE — Progress Notes (Signed)
PROGRESS NOTE                                                                                                                                                                                                             Patient Demographics:    Kenneth Bennett, is a 85 y.o. male, DOB - 10-02-1933, [id number]  Admit date - 04/14/2019   Admitting Physician Shela Leff, MD  Outpatient Primary MD for the patient is Lucianne Lei, MD  LOS - 3   Chief Complaint  Patient presents with  . Dizziness       Brief Narrative    85 y.o. male with medical history significant of HTN and DM presenting with dizziness.  He reports he was "dizzy-headed."  Symptoms started Thursday.  He moved the grass and suddenly got dizzy and felt like he was going to fall.  MRI done in the ED showing acute multiple small cerebellar infarcts. Neuro exam nonfocal.  Patient was seen by neurology, recommendation for dual antiplatelet therapy, plan for SNF placement.   Subjective:    [name] today is much more appropriate and pleasant today, less confused, follow commands .   Assessment  & Plan :    Principal Problem:   Acute CVA (cerebrovascular accident) [facility]) Active Problems:   Essential hypertension   Diabetes with neurologic complications (HCC)   CKD (chronic kidney disease) stage 4, GFR 15-29 ml/min (HCC)   Acute CVA -MRI head showing multiple small acute infarcts of the right cerebellum. -MRA head and neck significant for left common carotid artery occlusion at its origin. -2 D echo with EF 50 to 55%, no evidence of embolic source. -Continue with aspirin and Plavix for 3 weeks, then continue with aspirin alone -LDL  elevated at 153, on statin -PT/OT consulted recommendation for SNF placement  Acute metabolic encephalopathy/hospital delirium -She had an episode of acute confusion, agitation, where he required a bedside sitter, this has significantly  improved, this morning he is awake alert x3, appropriate and pleasant , this is most likely hospital delirium has significantly improved now .  HTN -Allow permissive HTNfor now -He has not been compliant with BP medications in quite some time  HLD -LDL elevated at 154, continue with Lipitor  DM -CBG overall well controlled, his A1c is well controlled at 6  CKD stage IV -Retaining  remains at baseline around 2, avoid nephrotoxic medication     Code Status : Full  Family Communication  : Discussed with wife via phone 9/14  Disposition Plan  : Will need SNF placement  Barriers For Discharge : acute CVA work up  Consults  :  Neurology  Procedures  : None  DVT Prophylaxis  : Subcu Lovenox  Lab Results  Component Value Date   PLT 250 04/17/2019    Antibiotics  :    Anti-infectives (From admission, onward)   None        Objective:   Vitals:   04/17/19 1658 04/17/19 2001 04/18/19 0838 04/18/19 1141  BP: (!) 168/81 (!) 150/85 (!) 124/52 (!) 146/92  Pulse: 98 94 81 72  Resp: 18 18 20    Temp:  98.7 F (37.1 C) 98.8 F (37.1 C) 97.8 F (36.6 C)  TempSrc:  Oral Oral Oral  SpO2: 100% 100% 97% 99%  Weight:      Height:        Wt Readings from Last 3 Encounters:  04/14/19 87.5 kg  01/22/15 87.5 kg  11/16/12 84 kg     Intake/Output Summary (Last 24 hours) at 04/18/2019 1433 Last data filed at 04/18/2019 0800 Gross per 24 hour  Intake -  Output 400 ml  Net -400 ml     Physical Exam  Awake Alert, Oriented X 3, speech is fluent, more appropriate and coherent today, remains with mild confusion. Symmetrical Chest wall movement, Good air movement bilaterally, CTAB RRR,No Gallops,Rubs or new Murmurs, No Parasternal Heave +ve B.Sounds, Abd Soft, No tenderness, No rebound - guarding or rigidity. No Cyanosis, Clubbing or edema, No new Rash or bruise       Data Review:    CBC Recent Labs  Lab 04/14/19 1759 04/17/19 0712  WBC 6.6 7.4  HGB 11.1*  11.6*  HCT 34.8* 35.6*  PLT 224 250  MCV 90.4 88.6  MCH 28.8 28.9  MCHC 31.9 32.6  RDW 16.3* 15.6*    Chemistries  Recent Labs  Lab 04/14/19 1759 04/17/19 0712  NA 139 140  K 4.9 4.5  CL 110 112*  CO2 19* 17*  GLUCOSE 134* 96  BUN 36* 30*  CREATININE 2.48* 2.21*  CALCIUM 9.4 9.2  AST 17  --   ALT 10  --   ALKPHOS 88  --   BILITOT 0.4  --    ------------------------------------------------------------------------------------------------------------------ Recent Labs    04/16/19 0359  CHOL 220*  HDL 44  LDLCALC 153*  TRIG 113  CHOLHDL 5.0    Lab Results  Component Value Date   HGBA1C 6.0 (H) 04/15/2019   ------------------------------------------------------------------------------------------------------------------ No results for input(s): TSH, T4TOTAL, T3FREE, THYROIDAB in the last 72 hours.  Invalid input(s): FREET3 ------------------------------------------------------------------------------------------------------------------ No results for input(s): VITAMINB12, FOLATE, FERRITIN, TIBC, IRON, RETICCTPCT in the last 72 hours.  Coagulation profile No results for input(s): INR, PROTIME in the last 168 hours.  No results for input(s): DDIMER in the last 72 hours.  Cardiac Enzymes No results for input(s): CKMB, TROPONINI, MYOGLOBIN in the last 168 hours.  Invalid input(s): CK ------------------------------------------------------------------------------------------------------------------ No results found for: BNP  Inpatient Medications  Scheduled Meds: . aspirin EC  81 mg Oral Daily  . atorvastatin  40 mg Oral q1800  . clopidogrel  75 mg Oral Daily  . enoxaparin (LOVENOX) injection  30 mg Subcutaneous Q24H  . feeding supplement (ENSURE ENLIVE)  237 mL Oral BID BM  . insulin aspart  0-15 Units Subcutaneous TID WC  Continuous Infusions: . sodium chloride 50 mL/hr at 04/17/19 1909   PRN Meds:.acetaminophen **OR** acetaminophen (TYLENOL) oral  liquid 160 mg/5 mL **OR** acetaminophen, senna-docusate  Micro Results Recent Results (from the past 240 hour(s))  SARS CORONAVIRUS 2 (TAT 6-24 HRS) Nasopharyngeal Nasopharyngeal Swab     Status: None   Collection Time: 04/15/19  5:50 AM   Specimen: Nasopharyngeal Swab  Result Value Ref Range Status   SARS Coronavirus 2 NEGATIVE NEGATIVE Final    Comment: (NOTE) SARS-CoV-2 target nucleic acids are NOT DETECTED. The SARS-CoV-2 RNA is generally detectable in upper and lower respiratory specimens during the acute phase of infection. Negative results do not preclude SARS-CoV-2 infection, do not rule out co-infections with other pathogens, and should not be used as the sole basis for treatment or other patient management decisions. Negative results must be combined with clinical observations, patient history, and epidemiological information. The expected result is Negative. Fact Sheet for Patients: SugarRoll.be Fact Sheet for Healthcare Providers: https://www.woods-mathews.com/ This test is not yet approved or cleared by the [place] FDA and  has been authorized for detection and/or diagnosis of SARS-CoV-2 by FDA under an Emergency Use Authorization (EUA). This EUA will remain  in effect (meaning this test can be used) for the duration of the COVID-19 declaration under Section 56 4(b)(1) of the Act, 21 U.S.C. section 360bbb-3(b)(1), unless the authorization is terminated or revoked sooner. Performed at [facility], [place] [address] [place], [place] [address]   Novel Coronavirus, NAA (hospital order; send-out to ref lab)     Status: None   Collection Time: 04/17/19  5:17 PM   Specimen: Nasopharyngeal Swab; Respiratory  Result Value Ref Range Status   SARS-CoV-2, NAA NOT DETECTED NOT DETECTED Final    Comment: (NOTE) This nucleic acid amplification test was developed and its performance characteristics determined by [employer]. Nucleic acid amplification tests include PCR and TMA. This test has not been FDA cleared or approved. This test has been authorized by FDA under an Emergency Use Authorization (EUA). This test is only authorized for the duration of time the declaration that circumstances exist justifying the authorization of the emergency use of in vitro diagnostic tests for detection of SARS-CoV-2 virus and/or diagnosis of COVID-19 infection under section 564(b)(1) of the Act, 21 U.S.C. [id number]) (1), unless the authorization is terminated or revoked sooner. When diagnostic testing is negative, the possibility of a false negative result should be considered in the context of a patient's recent exposures and the presence of clinical signs and symptoms consistent with COVID-19. An individual without symptoms of COVID- 19 and who is not shedding SARS-CoV-2 vi rus would expect to have a negative (not detected) result in this assay. Performed At: [facility] [address] [place], [place] [id number] Rush Farmer MD [id number]    [place]  Final    Comment: Performed at [facility], [place] [address] [place], [place] [address]    Radiology Reports Mr Angio Head Wo Contrast  Addendum Date: 04/15/2019   ADDENDUM REPORT: 04/15/2019 15:35 ADDENDUM: Study discussed by telephone with Dr. Karmen Bongo on 04/15/2019 at 1519 hours. Electronically Signed   By: Genevie Ann M.D.   On: 04/15/2019 15:35   Result Date: 04/15/2019 CLINICAL DATA:  85 year old male with dizziness found to have multiple small acute infarcts in the right cerebellum on MRI earlier today. EXAM: MRA HEAD WITHOUT CONTRAST MRA NECK WITHOUT CONTRAST TECHNIQUE: Angiographic images of the Circle of Willis were obtained  using MRA technique without intravenous contrast. Angiographic images of the neck were obtained using MRA technique without intravenous contrast. Carotid stenosis measurements  (when applicable) are obtained utilizing NASCET criteria, using the distal internal carotid diameter as the denominator. COMPARISON:  Brain MRI earlier today. FINDINGS: MRA NECK FINDINGS Time-of-flight images demonstrate a 3 vessel arch configuration with occlusion of the left common carotid artery at its origin (series 4, image 148). Antegrade flow in the right CCA and both subclavian arteries. Antegrade flow continues in the right carotid through the right carotid bifurcation and in the ICA to the skull base. Mild irregularity occurs at the right ICA origin and bulb with less than 50 % stenosis with respect to the distal vessel. Absent antegrade flow throughout the left carotid to the skull base. No proximal right subclavian artery or right vertebral artery origin stenosis suspected. The right vertebral is patent to the skull base without stenosis. No proximal left subclavian or left vertebral artery origin stenosis. The left vertebral is patent with antegrade flow to the skull base. Grossly negative visible neck and upper chest. MRA HEAD FINDINGS No intracranial mass effect or ventriculomegaly. Antegrade flow in the posterior circulation. The distal vertebral arteries appear codominant and are patent to the vertebrobasilar junction. Normal right PICA origin. The left PICA has a proximal origin which is patent. Patent vertebrobasilar junction and basilar artery. Patent SCA and PCA origins with prominent bilateral posterior communicating arteries. The right PCA has a fetal type origin. Bilateral PCA branches are within normal limits. Absent flow signal in the left ICA siphon until the level of the ophthalmic and posterior communicating artery origins. Reconstituted flow signal in the distal left ICA to the terminus. The left MCA and ACA origins are normal. There is only faint asymmetrically decreased flow signal in the left MCA M1 compared to the right. The left MCA bifurcation seems to be patent, but there is  asymmetrically decreased flow signal in the left M2 and M3 branches compared to the right. Subtle decreased flow in the left A1 compared to the right. The anterior communicating artery and ACA A2 segments are within normal limits. Antegrade flow in the right ICA siphon to the terminus without stenosis. Normal right posterior communicating artery origin. Normal right MCA and ACA origins. The right M1 and MCA bifurcation are patent. Visible MCA branches are within normal limits. IMPRESSION: 1. 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 No reconstituted flow until just before the left ICA terminus, which appears supplied by the left ophthalmic and posterior communicating arteries. 2. The left MCA and ACA remain patent although with varying degrees of asymmetrically decreased flow signal compared to the right side vessels. 3. Cervical right ICA atherosclerosis without significant stenosis. 4. No posterior circulation abnormality identified to correspond with the small right cerebellar infarcts seen on MRI earlier today. Electronically Signed: By: Genevie Ann M.D. On: 04/15/2019 15:14   Mr Angio Neck Wo Contrast  Addendum Date: 04/15/2019   ADDENDUM REPORT: 04/15/2019 15:35 ADDENDUM: Study discussed by telephone with Dr. Karmen Bongo on 04/15/2019 at 1519 hours. Electronically Signed   By: Genevie Ann M.D.   On: 04/15/2019 15:35   Result Date: 04/15/2019 CLINICAL DATA:  85 year old male with dizziness found to have multiple small acute infarcts in the right cerebellum on MRI earlier today. EXAM: MRA HEAD WITHOUT CONTRAST MRA NECK WITHOUT CONTRAST TECHNIQUE: Angiographic images of the Circle of Willis were obtained using MRA technique without intravenous contrast. Angiographic images of  the neck were obtained using MRA technique without intravenous contrast. Carotid stenosis measurements (when applicable) are obtained utilizing NASCET criteria, using the distal internal carotid diameter as the  denominator. COMPARISON:  Brain MRI earlier today. FINDINGS: MRA NECK FINDINGS Time-of-flight images demonstrate a 3 vessel arch configuration with occlusion of the left common carotid artery at its origin (series 4, image 148). Antegrade flow in the right CCA and both subclavian arteries. Antegrade flow continues in the right carotid through the right carotid bifurcation and in the ICA to the skull base. Mild irregularity occurs at the right ICA origin and bulb with less than 50 % stenosis with respect to the distal vessel. Absent antegrade flow throughout the left carotid to the skull base. No proximal right subclavian artery or right vertebral artery origin stenosis suspected. The right vertebral is patent to the skull base without stenosis. No proximal left subclavian or left vertebral artery origin stenosis. The left vertebral is patent with antegrade flow to the skull base. Grossly negative visible neck and upper chest. MRA HEAD FINDINGS No intracranial mass effect or ventriculomegaly. Antegrade flow in the posterior circulation. The distal vertebral arteries appear codominant and are patent to the vertebrobasilar junction. Normal right PICA origin. The left PICA has a proximal origin which is patent. Patent vertebrobasilar junction and basilar artery. Patent SCA and PCA origins with prominent bilateral posterior communicating arteries. The right PCA has a fetal type origin. Bilateral PCA branches are within normal limits. Absent flow signal in the left ICA siphon until the level of the ophthalmic and posterior communicating artery origins. Reconstituted flow signal in the distal left ICA to the terminus. The left MCA and ACA origins are normal. There is only faint asymmetrically decreased flow signal in the left MCA M1 compared to the right. The left MCA bifurcation seems to be patent, but there is asymmetrically decreased flow signal in the left M2 and M3 branches compared to the right. Subtle decreased flow  in the left A1 compared to the right. The anterior communicating artery and ACA A2 segments are within normal limits. Antegrade flow in the right ICA siphon to the terminus without stenosis. Normal right posterior communicating artery origin. Normal right MCA and ACA origins. The right M1 and MCA bifurcation are patent. Visible MCA branches are within normal limits. IMPRESSION: 1. 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 No reconstituted flow until just before the left ICA terminus, which appears supplied by the left ophthalmic and posterior communicating arteries. 2. The left MCA and ACA remain patent although with varying degrees of asymmetrically decreased flow signal compared to the right side vessels. 3. Cervical right ICA atherosclerosis without significant stenosis. 4. No posterior circulation abnormality identified to correspond with the small right cerebellar infarcts seen on MRI earlier today. Electronically Signed: By: Genevie Ann M.D. On: 04/15/2019 15:14   Mr Brain Wo Contrast  Result Date: 04/15/2019 CLINICAL DATA:  Dizziness for 3-4 days. EXAM: MRI HEAD WITHOUT CONTRAST TECHNIQUE: Multiplanar, multiecho pulse sequences of the brain and surrounding structures were obtained without intravenous contrast. COMPARISON:  None. FINDINGS: BRAIN: There are multiple small acute infarcts of the right cerebellar hemisphere. No acute ischemia elsewhere in the brain. Multifocal white matter hyperintensity, most commonly due to chronic ischemic microangiopathy. There is generalized atrophy without lobar predilection. The midline structures are normal. There is an old left cerebellar infarct. VASCULAR: The major intracranial arterial and venous sinus flow voids are normal. Single focus of chronic microhemorrhage in  the left hemisphere no acute hemorrhage. SKULL AND UPPER CERVICAL SPINE: Calvarial bone marrow signal is normal. There is no skull base mass. The visualized upper cervical  spine and soft tissues are normal. SINUSES/ORBITS: There are no fluid levels or advanced mucosal thickening. The mastoid air cells and middle ear cavities are free of fluid. The orbits are normal. IMPRESSION: 1. 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 2. No hemorrhage or mass effect. 3. Atrophy and chronic microvascular ischemia. Electronically Signed   By: Ulyses Jarred M.D.   On: 04/15/2019 04:48      Phillips Climes M.D on 04/18/2019 at 2:33 PM  Between 7am to 7pm - Pager - [phone]  After 7pm go to [email or web address] - password [facility]  [facility] -  Office  [phone]

## 2019-04-18 NOTE — Progress Notes (Signed)
  Speech Language Pathology Treatment: Cognitive-Linquistic  Patient Details Name: Kenneth Bennett MRN: [id number] DOB: 1934-02-01 Today's Date: 04/18/2019 Time: [id number] SLP Time Calculation (min) (ACUTE ONLY): 28 min  Assessment / Plan / Recommendation Clinical Impression  Pt wife was present for treatment and she reported that the pt did have some difficulty with memory at baseline but she denied any other cognitive-linguistic deficits. Pt's word retrieval difficult has improved and pt as well as his wife indicated that the pt's language skills are back to baseline. The [facility] Cognitive Assessment 8.1 was completed to formally evaluate the pt's cognitive-linguistic skills. He achieved a score of 8/30 which is below the normal limits of 26 or more out of 30 and is suggestive of a severe impairment. He demonstrated deficits in the areas of executive function, problem solving, attention, mental manipulation, divergent naming, abstract reasoning, delayed recall, and orientation to time. Skilled SLP services are clinically indicated at this time to improve his cognitive-linguistic skills. Pt, his wife, and nursing were educated regarding results and recommendations; all parties verbalized understanding as well as agreement with plan of care.      SLP Evaluation Cognition  Overall Cognitive Status: Impaired/Different from baseline Arousal/Alertness: Awake/alert Orientation Level: Oriented to person;Oriented to place;Oriented to situation(Oriented to month, Date: 4th/5th; Year: 2007; Day: Thursday) Attention: Focused;Sustained Focused Attention: Impaired Focused Attention Impairment: Verbal complex(Vigilance impaired: 0/1) Sustained Attention: Impaired Sustained Attention Impairment: Verbal complex(Serial 7s: 0/3) Memory: Impaired Memory Impairment: Retrieval deficit;Storage deficit;Decreased recall of new information(Immediate: 3/5; delayed: 0/5; with cues: 4/5) Awareness:  Impaired Awareness Impairment: Intellectual impairment Problem Solving: Impaired Problem Solving Impairment: Verbal complex Executive Function: Reasoning;Sequencing;Organizing Reasoning: Impaired Reasoning Impairment: Verbal complex(Abstraction: 1/2) Sequencing: Impaired Sequencing Impairment: Verbal complex(Clock drawing: 1/3) Organizing: Impaired Organizing Impairment: Verbal complex(Forward digit span: 1/1; Backward digit span: 0/1)       Comprehension  Auditory Comprehension Overall Auditory Comprehension: Appears within functional limits for tasks assessed Yes/No Questions: Within Functional Limits Commands: Impaired Complex Commands: (Trail completion: 0/1) Conversation: Simple    Expression Expression Primary Mode of Expression: Verbal Verbal Expression Overall Verbal Expression: Appears within functional limits for tasks assessed Initiation: No impairment Repetition: Impaired Level of Impairment: Sentence level(0/2) Naming: Impairment Confrontation: (1/3) Convergent: Not tested Divergent: (0/1 ( pt provided 1 word in 1 minute)) Verbal Errors: (None noted) Pragmatics: No impairment   Oral / Motor  Motor Speech Overall Motor Speech: Appears within functional limits for tasks assessed Intelligibility: Intelligible      HPI HPI: 85 y.o. male with medical history significant of HTN and DM presenting with dizziness.  He reports he was "dizzy-headed."  Symptoms started Thursday.  He moved the grass and suddenly got dizzy and felt like he was going to fall.   MRI done in the ED showing acute multiple small right cerebellar infarcts.  Neuro exam nonfocal.       SLP Plan  Goals updated       Recommendations                   Follow up Recommendations: Skilled Nursing facility;24 hour supervision/assistance SLP Visit Diagnosis: Cognitive communication deficit [id number]) Plan: Continue with current plan of care       Karey Suthers I. Hardin Negus, [place], [place] Office number [phone] Pager [place] 04/18/2019, 5:52 PM

## 2019-04-19 LAB — GLUCOSE, CAPILLARY
Glucose-Capillary: 79 mg/dL (ref 70–99)
Glucose-Capillary: 100 mg/dL — ABNORMAL HIGH (ref 70–99)

## 2019-04-19 MED ORDER — ENSURE ENLIVE PO LIQD: 237.0000 mL | Freq: Two times a day (BID) | ORAL | 12 refills | Status: AC

## 2019-04-19 MED ORDER — ACETAMINOPHEN 325 MG PO TABS: 650.0000 mg | ORAL_TABLET | Freq: Four times a day (QID) | ORAL | Status: AC | PRN

## 2019-04-19 MED ORDER — ASPIRIN 81 MG PO TBEC: 81.0000 mg | DELAYED_RELEASE_TABLET | Freq: Every day | ORAL | Status: AC

## 2019-04-19 MED ORDER — CLOPIDOGREL BISULFATE 75 MG PO TABS: 75.0000 mg | ORAL_TABLET | Freq: Every day | ORAL | Status: DC

## 2019-04-19 MED ORDER — SENNOSIDES-DOCUSATE SODIUM 8.6-50 MG PO TABS: 1.0000 | ORAL_TABLET | Freq: Every evening | ORAL | Status: AC | PRN

## 2019-04-19 MED ORDER — AMLODIPINE BESYLATE 5 MG PO TABS: 5.0000 mg | ORAL_TABLET | Freq: Every day | ORAL | 11 refills | Status: DC

## 2019-04-19 MED ORDER — LISINOPRIL 5 MG PO TABS: 5.0000 mg | ORAL_TABLET | Freq: Every day | ORAL | 11 refills | Status: DC

## 2019-04-19 MED ORDER — ATORVASTATIN CALCIUM 40 MG PO TABS: 40.0000 mg | ORAL_TABLET | Freq: Every day | ORAL | Status: DC

## 2019-04-19 NOTE — Discharge Summary (Signed)
Kenneth Bennett, is a 85 y.o. male  DOB January 06, 1934  MRN [id number].  Admission date:  04/14/2019  Admitting Physician  Shela Leff, MD  Discharge Date:  04/19/2019   Primary MD  Lucianne Lei, MD  Recommendations for primary care physician for things to follow:  -Check CBC, BMP during next week, monitor CBG closely, so far no indication for hypoglycemic agent, but if start to increase consider that, added on low-dose lisinopril, this can be uptitrated gradually for good blood pressure control.   Admission Diagnosis  Acute CVA (cerebrovascular accident) [facility]) [I63.9]   Discharge Diagnosis  Acute CVA (cerebrovascular accident) ([place]) [I63.9]    Principal Problem:   Acute CVA (cerebrovascular accident) ([place]) Active Problems:   Essential hypertension   Diabetes with neurologic complications (HCC)   CKD (chronic kidney disease) stage 4, GFR 15-29 ml/min (HCC)      Past Medical History:  Diagnosis Date   Cataracts, both eyes    Hx: of   CKD (chronic kidney disease) stage 4, GFR 15-29 ml/min ([place]) 04/15/2019   CVA (cerebral vascular accident) ([place])    Diabetes mellitus without complication ([place])    denies history   Hypertension    not taking medicine recently    Past Surgical History:  Procedure Laterality Date   CATARACT EXTRACTION W/PHACO Left 11/16/2012   Procedure: CATARACT EXTRACTION PHACO AND INTRAOCULAR LENS PLACEMENT ([place]);  Surgeon: Adonis Brook, MD;  Location: [facility];  Service: Ophthalmology;  Laterality: Left;   COLONOSCOPY     Hx: of   HERNIA REPAIR         History of present illness and  Hospital Course:     Kindly see H&P for history of present illness and admission details, please review complete Labs, Consult reports and Test reports for all details in brief  HPI  from the history and physical done on the day of admission 04/15/2019  HPI: KENDEN FREDE is a 85  y.o. male with medical history significant of HTN and DM presenting with dizziness.  He reports he was "dizzy-headed."  Symptoms started Thursday.  He moved the grass and suddenly got dizzy and felt like he was going to fall.  It went away but came back again the next morning.  No dysphagia, dysarthria, no N/W/T.  His dizziness is currently resolved.    ED Course: [place], per Dr. Marlowe Sax:  Patient with history of hypertension hyperlipidemia presenting with a 1 day history of feeling off balance/trouble walking. MRI done in the ED showing acute multiple small cerebellar infarcts. Neuro exam nonfocal. Dr. Cheral Marker from neurology to see the patient.  Hospital Course   85 y.o.malewith medical history significant ofHTN and DM presenting with dizziness.He reports he was "dizzy-headed." Symptoms started Thursday. He moved the grass and suddenly got dizzy and felt like he was going to fall.  MRI done in the ED showing acute multiple small cerebellar infarcts. Neuro exam nonfocal.  Patient was seen by neurology, recommendation for dual antiplatelet therapy, plan for SNF placement.  Acute  CVA -MRI head showing multiple small acute infarcts of the right cerebellum. -MRA head and neck significant for left common carotid artery occlusion at its origin. -2 D echo with EF 50 to 55%, no evidence of embolic source. -Continue with aspirin and Plavix for 3 weeks, then continue with aspirin alone -LDL  elevated at 153, on statin -PT/OT consulted recommendation for SNF placement  Acute metabolic encephalopathy/hospital delirium -She had an episode of acute confusion, agitation, where he required a bedside sitter, this has significantly improved, this morning he is awake alert x3, appropriate and pleasant , this is most likely hospital delirium has significantly improved now .  HTN -Allow permissive HTNduring hospital stay, he has not been compliant with blood pressure medicine for some time,  he was started on amlodipine at time of discharge, this can be uptitrated gradually for optimal blood pressure control.  HLD -LDL elevated at 154, continue with Lipitor  DM -CBG overall well controlled, his A1c is well controlled at 6, he is with no insulin requirement during hospital stay, no new medication on discharge  CKD stage IV -Retaining remains at baseline around 2, avoid nephrotoxic medication     Discharge Condition:  Stable  Follow UP  Contact information for after-discharge care    Destination    [facility] Preferred SNF .   Service: Skilled Nursing Contact information: [address] [place] [phone]                Discharge Instructions  and  Discharge Medications     Discharge Instructions    Discharge instructions   Complete by: As directed    Follow with Primary MD Lucianne Lei, MD   Get CBC, CMP,  checked  by Primary MD next visit.    Activity: As tolerated with Full fall precautions use walker/cane & assistance as needed   Disposition SNF   Diet: Heart Healthy /carb modified , with feeding assistance and aspiration precautions.  For Heart failure patients - Check your Weight same time everyday, if you gain over 2 pounds, or you develop in leg swelling, experience more shortness of breath or chest pain, call your Primary MD immediately. Follow Cardiac Low Salt Diet and 1.5 lit/day fluid restriction.   On your next visit with your primary care physician please Get Medicines reviewed and adjusted.   Please request your Prim.MD to go over all Hospital Tests and Procedure/Radiological results at the follow up, please get all Hospital records sent to your Prim MD by signing hospital release before you go home.   If you experience worsening of your admission symptoms, develop shortness of breath, life threatening emergency, suicidal or homicidal thoughts you must seek medical  attention immediately by calling 911 or calling your MD immediately  if symptoms less severe.  You Must read complete instructions/literature along with all the possible adverse reactions/side effects for all the Medicines you take and that have been prescribed to you. Take any new Medicines after you have completely understood and accpet all the possible adverse reactions/side effects.   Do not drive, operating heavy machinery, perform activities at heights, swimming or participation in water activities or provide baby sitting services if your were admitted for syncope or siezures until you have seen by Primary MD or a Neurologist and advised to do so again.  Do not drive when taking Pain medications.    Do not take more than prescribed Pain, Sleep and Anxiety Medications  Special Instructions: If  you have smoked or chewed Tobacco  in the last 2 yrs please stop smoking, stop any regular Alcohol  and or any Recreational drug use.  Wear Seat belts while driving.   Please note  You were cared for by a hospitalist during your hospital stay. If you have any questions about your discharge medications or the care you received while you were in the hospital after you are discharged, you can call the unit and asked to speak with the hospitalist on call if the hospitalist that took care of you is not available. Once you are discharged, your primary care physician will handle any further medical issues. Please note that NO REFILLS for any discharge medications will be authorized once you are discharged, as it is imperative that you return to your primary care physician (or establish a relationship with a primary care physician if you do not have one) for your aftercare needs so that they can reassess your need for medications and monitor your lab values.   Increase activity slowly   Complete by: As directed      Allergies as of 04/19/2019   No Known Allergies     Medication List    TAKE these  medications   acetaminophen 325 MG tablet Commonly known as: TYLENOL Take 2 tablets (650 mg total) by mouth every 6 (six) hours as needed for mild pain (or temp > 37.5 C (99.5 F)).   amLODipine 5 MG tablet Commonly known as: NORVASC Take 1 tablet (5 mg total) by mouth daily.   aspirin 81 MG EC tablet Take 1 tablet (81 mg total) by mouth daily. Start taking on: April 20, 2019   atorvastatin 40 MG tablet Commonly known as: LIPITOR Take 1 tablet (40 mg total) by mouth daily at 6 PM.   clopidogrel 75 MG tablet Commonly known as: PLAVIX Take 1 tablet (75 mg total) by mouth daily for 20 days. Take for 3 weeks then stop Start taking on: April 20, 2019   feeding supplement (ENSURE ENLIVE) Liqd Take 237 mLs by mouth 2 (two) times daily between meals.   senna-docusate 8.6-50 MG tablet Commonly known as: Senokot-S Take 1 tablet by mouth at bedtime as needed for mild constipation.         Diet and Activity recommendation: See Discharge Instructions above   Consults obtained - neurology   Major procedures and Radiology Reports - PLEASE review detailed and final reports for all details, in brief -      Mr Angio Head Wo Contrast  Addendum Date: 04/15/2019   ADDENDUM REPORT: 04/15/2019 15:35 ADDENDUM: Study discussed by telephone with Dr. Karmen Bongo on 04/15/2019 at 1519 hours. Electronically Signed   By: Genevie Ann M.D.   On: 04/15/2019 15:35   Result Date: 04/15/2019 CLINICAL DATA:  85 year old male with dizziness found to have multiple small acute infarcts in the right cerebellum on MRI earlier today. EXAM: MRA HEAD WITHOUT CONTRAST MRA NECK WITHOUT CONTRAST TECHNIQUE: Angiographic images of the Circle of Willis were obtained using MRA technique without intravenous contrast. Angiographic images of the neck were obtained using MRA technique without intravenous contrast. Carotid stenosis measurements (when applicable) are obtained utilizing NASCET criteria, using the distal  internal carotid diameter as the denominator. COMPARISON:  Brain MRI earlier today. FINDINGS: MRA NECK FINDINGS Time-of-flight images demonstrate a 3 vessel arch configuration with occlusion of the left common carotid artery at its origin (series 4, image 148). Antegrade flow in the right CCA and both subclavian arteries. Antegrade  flow continues in the right carotid through the right carotid bifurcation and in the ICA to the skull base. Mild irregularity occurs at the right ICA origin and bulb with less than 50 % stenosis with respect to the distal vessel. Absent antegrade flow throughout the left carotid to the skull base. No proximal right subclavian artery or right vertebral artery origin stenosis suspected. The right vertebral is patent to the skull base without stenosis. No proximal left subclavian or left vertebral artery origin stenosis. The left vertebral is patent with antegrade flow to the skull base. Grossly negative visible neck and upper chest. MRA HEAD FINDINGS No intracranial mass effect or ventriculomegaly. Antegrade flow in the posterior circulation. The distal vertebral arteries appear codominant and are patent to the vertebrobasilar junction. Normal right PICA origin. The left PICA has a proximal origin which is patent. Patent vertebrobasilar junction and basilar artery. Patent SCA and PCA origins with prominent bilateral posterior communicating arteries. The right PCA has a fetal type origin. Bilateral PCA branches are within normal limits. Absent flow signal in the left ICA siphon until the level of the ophthalmic and posterior communicating artery origins. Reconstituted flow signal in the distal left ICA to the terminus. The left MCA and ACA origins are normal. There is only faint asymmetrically decreased flow signal in the left MCA M1 compared to the right. The left MCA bifurcation seems to be patent, but there is asymmetrically decreased flow signal in the left M2 and M3 branches compared to  the right. Subtle decreased flow in the left A1 compared to the right. The anterior communicating artery and ACA A2 segments are within normal limits. Antegrade flow in the right ICA siphon to the terminus without stenosis. Normal right posterior communicating artery origin. Normal right MCA and ACA origins. The right M1 and MCA bifurcation are patent. Visible MCA branches are within normal limits. IMPRESSION: 1. 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 No reconstituted flow until just before the left ICA terminus, which appears supplied by the left ophthalmic and posterior communicating arteries. 2. The left MCA and ACA remain patent although with varying degrees of asymmetrically decreased flow signal compared to the right side vessels. 3. Cervical right ICA atherosclerosis without significant stenosis. 4. No posterior circulation abnormality identified to correspond with the small right cerebellar infarcts seen on MRI earlier today. Electronically Signed: By: Genevie Ann M.D. On: 04/15/2019 15:14   Mr Angio Neck Wo Contrast  Addendum Date: 04/15/2019   ADDENDUM REPORT: 04/15/2019 15:35 ADDENDUM: Study discussed by telephone with Dr. Karmen Bongo on 04/15/2019 at 1519 hours. Electronically Signed   By: Genevie Ann M.D.   On: 04/15/2019 15:35   Result Date: 04/15/2019 CLINICAL DATA:  85 year old male with dizziness found to have multiple small acute infarcts in the right cerebellum on MRI earlier today. EXAM: MRA HEAD WITHOUT CONTRAST MRA NECK WITHOUT CONTRAST TECHNIQUE: Angiographic images of the Circle of Willis were obtained using MRA technique without intravenous contrast. Angiographic images of the neck were obtained using MRA technique without intravenous contrast. Carotid stenosis measurements (when applicable) are obtained utilizing NASCET criteria, using the distal internal carotid diameter as the denominator. COMPARISON:  Brain MRI earlier today. FINDINGS: MRA NECK FINDINGS  Time-of-flight images demonstrate a 3 vessel arch configuration with occlusion of the left common carotid artery at its origin (series 4, image 148). Antegrade flow in the right CCA and both subclavian arteries. Antegrade flow continues in the right carotid through the right  carotid bifurcation and in the ICA to the skull base. Mild irregularity occurs at the right ICA origin and bulb with less than 50 % stenosis with respect to the distal vessel. Absent antegrade flow throughout the left carotid to the skull base. No proximal right subclavian artery or right vertebral artery origin stenosis suspected. The right vertebral is patent to the skull base without stenosis. No proximal left subclavian or left vertebral artery origin stenosis. The left vertebral is patent with antegrade flow to the skull base. Grossly negative visible neck and upper chest. MRA HEAD FINDINGS No intracranial mass effect or ventriculomegaly. Antegrade flow in the posterior circulation. The distal vertebral arteries appear codominant and are patent to the vertebrobasilar junction. Normal right PICA origin. The left PICA has a proximal origin which is patent. Patent vertebrobasilar junction and basilar artery. Patent SCA and PCA origins with prominent bilateral posterior communicating arteries. The right PCA has a fetal type origin. Bilateral PCA branches are within normal limits. Absent flow signal in the left ICA siphon until the level of the ophthalmic and posterior communicating artery origins. Reconstituted flow signal in the distal left ICA to the terminus. The left MCA and ACA origins are normal. There is only faint asymmetrically decreased flow signal in the left MCA M1 compared to the right. The left MCA bifurcation seems to be patent, but there is asymmetrically decreased flow signal in the left M2 and M3 branches compared to the right. Subtle decreased flow in the left A1 compared to the right. The anterior communicating artery and  ACA A2 segments are within normal limits. Antegrade flow in the right ICA siphon to the terminus without stenosis. Normal right posterior communicating artery origin. Normal right MCA and ACA origins. The right M1 and MCA bifurcation are patent. Visible MCA branches are within normal limits. IMPRESSION: 1. 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 No reconstituted flow until just before the left ICA terminus, which appears supplied by the left ophthalmic and posterior communicating arteries. 2. The left MCA and ACA remain patent although with varying degrees of asymmetrically decreased flow signal compared to the right side vessels. 3. Cervical right ICA atherosclerosis without significant stenosis. 4. No posterior circulation abnormality identified to correspond with the small right cerebellar infarcts seen on MRI earlier today. Electronically Signed: By: Genevie Ann M.D. On: 04/15/2019 15:14   Mr Brain Wo Contrast  Result Date: 04/15/2019 CLINICAL DATA:  Dizziness for 3-4 days. EXAM: MRI HEAD WITHOUT CONTRAST TECHNIQUE: Multiplanar, multiecho pulse sequences of the brain and surrounding structures were obtained without intravenous contrast. COMPARISON:  None. FINDINGS: BRAIN: There are multiple small acute infarcts of the right cerebellar hemisphere. No acute ischemia elsewhere in the brain. Multifocal white matter hyperintensity, most commonly due to chronic ischemic microangiopathy. There is generalized atrophy without lobar predilection. The midline structures are normal. There is an old left cerebellar infarct. VASCULAR: The major intracranial arterial and venous sinus flow voids are normal. Single focus of chronic microhemorrhage in the left hemisphere no acute hemorrhage. SKULL AND UPPER CERVICAL SPINE: Calvarial bone marrow signal is normal. There is no skull base mass. The visualized upper cervical spine and soft tissues are normal. SINUSES/ORBITS: There are no fluid levels or  advanced mucosal thickening. The mastoid air cells and middle ear cavities are free of fluid. The orbits are normal. IMPRESSION: 1. 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 2. No hemorrhage or mass effect. 3. Atrophy and chronic microvascular ischemia. Electronically  Signed   By: Ulyses Jarred M.D.   On: 04/15/2019 04:48    Micro Results     Recent Results (from the past 240 hour(s))  SARS CORONAVIRUS 2 (TAT 6-24 HRS) Nasopharyngeal Nasopharyngeal Swab     Status: None   Collection Time: 04/15/19  5:50 AM   Specimen: Nasopharyngeal Swab  Result Value Ref Range Status   SARS Coronavirus 2 NEGATIVE NEGATIVE Final    Comment: (NOTE) SARS-CoV-2 target nucleic acids are NOT DETECTED. The SARS-CoV-2 RNA is generally detectable in upper and lower respiratory specimens during the acute phase of infection. Negative results do not preclude SARS-CoV-2 infection, do not rule out co-infections with other pathogens, and should not be used as the sole basis for treatment or other patient management decisions. Negative results must be combined with clinical observations, patient history, and epidemiological information. The expected result is Negative. Fact Sheet for Patients: SugarRoll.be Fact Sheet for Healthcare Providers: https://www.woods-mathews.com/ This test is not yet approved or cleared by the [place] FDA and  has been authorized for detection and/or diagnosis of SARS-CoV-2 by FDA under an Emergency Use Authorization (EUA). This EUA will remain  in effect (meaning this test can be used) for the duration of the COVID-19 declaration under Section 56 4(b)(1) of the Act, 21 U.S.C. section 360bbb-3(b)(1), unless the authorization is terminated or revoked sooner. Performed at [facility], [place] [address], [place], [place] [address]   Novel Coronavirus, NAA (hospital order; send-out to ref lab)     Status: None   Collection  Time: 04/17/19  5:17 PM   Specimen: Nasopharyngeal Swab; Respiratory  Result Value Ref Range Status   SARS-CoV-2, NAA NOT DETECTED NOT DETECTED Final    Comment: (NOTE) This nucleic acid amplification test was developed and its performance characteristics determined by [employer]. Nucleic acid amplification tests include PCR and TMA. This test has not been FDA cleared or approved. This test has been authorized by FDA under an Emergency Use Authorization (EUA). This test is only authorized for the duration of time the declaration that circumstances exist justifying the authorization of the emergency use of in vitro diagnostic tests for detection of SARS-CoV-2 virus and/or diagnosis of COVID-19 infection under section 564(b)(1) of the Act, 21 U.S.C. [id number]) (1), unless the authorization is terminated or revoked sooner. When diagnostic testing is negative, the possibility of a false negative result should be considered in the context of a patient's recent exposures and the presence of clinical signs and symptoms consistent with COVID-19. An individual without symptoms of COVID- 19 and who is not shedding SARS-CoV-2 vi rus would expect to have a negative (not detected) result in this assay. Performed At: [facility] [address] [place], [place] [id number] Rush Farmer MD [id number]    [place]  Final    Comment: Performed at [facility], [place] [address] [place], [place] [address]  SARS CORONAVIRUS 2 (TAT 6-24 HRS) Nasopharyngeal Nasopharyngeal Swab     Status: None   Collection Time: 04/18/19  4:16 PM   Specimen: Nasopharyngeal Swab  Result Value Ref Range Status   SARS Coronavirus 2 NEGATIVE NEGATIVE Final    Comment: (NOTE) SARS-CoV-2 target nucleic acids are NOT DETECTED. The SARS-CoV-2 RNA is generally detectable in upper and lower respiratory specimens during the acute phase of infection. Negative results do not  preclude SARS-CoV-2 infection, do not rule out co-infections with other pathogens, and should not be used as the sole basis for treatment or  other patient management decisions. Negative results must be combined with clinical observations, patient history, and epidemiological information. The expected result is Negative. Fact Sheet for Patients: SugarRoll.be Fact Sheet for Healthcare Providers: https://www.woods-mathews.com/ This test is not yet approved or cleared by the [place] FDA and  has been authorized for detection and/or diagnosis of SARS-CoV-2 by FDA under an Emergency Use Authorization (EUA). This EUA will remain  in effect (meaning this test can be used) for the duration of the COVID-19 declaration under Section 56 4(b)(1) of the Act, 21 U.S.C. section 360bbb-3(b)(1), unless the authorization is terminated or revoked sooner. Performed at [facility], [place] [address], [place], [place] [address]        Today   Subjective:   Erian Axson today has no headache,no chest or abdominal pain,no new weakness tingling or numbness, feels much better today.   Objective:   Blood pressure (!) 150/70, pulse 68, temperature 97.9 F (36.6 C), temperature source Oral, resp. rate 16, height 5\' 6"  (1.676 m), weight 87.5 kg, SpO2 98 %.   Intake/Output Summary (Last 24 hours) at 04/19/2019 1338 Last data filed at 04/19/2019 0400 Gross per 24 hour  Intake --  Output 700 ml  Net -700 ml    Exam Awake Alert, Oriented x 3, coherent and appropriate, but sometimes forgetful Symmetrical Chest wall movement, Good air movement bilaterally, CTAB RRR,No Gallops,Rubs or new Murmurs, No Parasternal Heave +ve B.Sounds, Abd Soft, Non tender,No rebound -guarding or rigidity. No Cyanosis, Clubbing or edema, No new Rash or bruise  Data Review   CBC w Diff:  Lab Results  Component Value Date   WBC 7.4 04/17/2019   HGB 11.6 (L) 04/17/2019    HCT 35.6 (L) 04/17/2019   PLT 250 04/17/2019   LYMPHOPCT 11 (L) 01/19/2010   MONOPCT 16 (H) 01/19/2010   EOSPCT 1 01/19/2010   BASOPCT 0 01/19/2010    CMP:  Lab Results  Component Value Date   NA 140 04/17/2019   K 4.5 04/17/2019   CL 112 (H) 04/17/2019   CO2 17 (L) 04/17/2019   BUN 30 (H) 04/17/2019   CREATININE 2.21 (H) 04/17/2019   PROT 7.5 04/14/2019   ALBUMIN 3.7 04/14/2019   BILITOT 0.4 04/14/2019   ALKPHOS 88 04/14/2019   AST 17 04/14/2019   ALT 10 04/14/2019  .   Total Time in preparing paper work, data evaluation and todays exam - 63 minutes  Phillips Climes M.D on 04/19/2019 at [facility]   Office  [phone]

## 2019-04-19 NOTE — TOC Transition Note (Addendum)
Transition of Care [facility]) - CM/SW Discharge Note   Patient Details  Name: Kenneth Bennett MRN: [id number] Date of Birth: 1934/04/13  Transition of Care [facility]) CM/SW Contact:  Pollie Friar, RN Phone Number: 04/19/2019, 2:24 PM   Clinical Narrative:    Pt is discharging to [facility] today. Wife has been contacted and will go to facility after work to sign the paperwork Pt to transport via [facility]. Bedside RN updated and d/c packet at the desk.   Room number: 101 Number for report: [phone]   Final next level of care: Skilled Nursing Facility Barriers to Discharge: No Barriers Identified   Patient Goals and CMS Choice   CMS Medicare.gov Compare Post Acute Care list provided to:: Patient Represenative (must comment) Choice offered to / list presented to : Spouse  Discharge Placement              Patient chooses bed at: [facility] Patient to be transferred to facility by: [facility] Name of family member notified: Jennette Patient and family notified of of transfer: 04/19/19  Discharge Plan and Services In-house Referral: Clinical Social Work Discharge Planning Services: [employer] Consult Post Acute Care Choice: [place]                               Social Determinants of Health (SDOH) Interventions     Readmission Risk Interventions No flowsheet data found.

## 2019-04-19 NOTE — Discharge Instructions (Signed)
Follow with Primary MD Lucianne Lei, MD   Get CBC, CMP,  checked  by Primary MD next visit.    Activity: As tolerated with Full fall precautions use walker/cane & assistance as needed   Disposition SNF   Diet: Heart Healthy /carb modified , with feeding assistance and aspiration precautions.  For Heart failure patients - Check your Weight same time everyday, if you gain over 2 pounds, or you develop in leg swelling, experience more shortness of breath or chest pain, call your Primary MD immediately. Follow Cardiac Low Salt Diet and 1.5 lit/day fluid restriction.   On your next visit with your primary care physician please Get Medicines reviewed and adjusted.   Please request your Prim.MD to go over all Hospital Tests and Procedure/Radiological results at the follow up, please get all Hospital records sent to your Prim MD by signing hospital release before you go home.   If you experience worsening of your admission symptoms, develop shortness of breath, life threatening emergency, suicidal or homicidal thoughts you must seek medical attention immediately by calling 911 or calling your MD immediately  if symptoms less severe.  You Must read complete instructions/literature along with all the possible adverse reactions/side effects for all the Medicines you take and that have been prescribed to you. Take any new Medicines after you have completely understood and accpet all the possible adverse reactions/side effects.   Do not drive, operating heavy machinery, perform activities at heights, swimming or participation in water activities or provide baby sitting services if your were admitted for syncope or siezures until you have seen by Primary MD or a Neurologist and advised to do so again.  Do not drive when taking Pain medications.    Do not take more than prescribed Pain, Sleep and Anxiety Medications  Special Instructions: If you have smoked or chewed Tobacco  in the last 2 yrs  please stop smoking, stop any regular Alcohol  and or any Recreational drug use.  Wear Seat belts while driving.   Please note  You were cared for by a hospitalist during your hospital stay. If you have any questions about your discharge medications or the care you received while you were in the hospital after you are discharged, you can call the unit and asked to speak with the hospitalist on call if the hospitalist that took care of you is not available. Once you are discharged, your primary care physician will handle any further medical issues. Please note that NO REFILLS for any discharge medications will be authorized once you are discharged, as it is imperative that you return to your primary care physician (or establish a relationship with a primary care physician if you do not have one) for your aftercare needs so that they can reassess your need for medications and monitor your lab values.

## 2019-04-19 NOTE — Progress Notes (Signed)
  Speech Language Pathology Treatment: Cognitive-Linquistic  Patient Details Name: Kenneth Bennett MRN: [id number] DOB: 14-Nov-1933 Today's Date: 04/19/2019 Time: [id number] SLP Time Calculation (min) (ACUTE ONLY): 31 min  Assessment / Plan / Recommendation Clinical Impression  Pt was seen for cognitive-linguistic treatment and was cooperative throughout the session. He provided 8-11 items per concrete category during divergent naming tasks with an average of 9 items per category when min-mod cues were given. He demonstrated 100% accuracy with 4-item immediate recall and 60% accuracy with recall of 5 items increasing to 100% with mod cues. He completed abstract category naming with 40% accuracy increasing to 100% with mod cues. With time management problems he achieved 60% accuracy increasing to 80% with mod verbal and visual cues. Pt currently has discharge orders and would benefit from continued SLP services upon discharge.    HPI HPI: 85 y.o. male with medical history significant of HTN and DM presenting with dizziness.  He reports he was "dizzy-headed."  Symptoms started Thursday.  He moved the grass and suddenly got dizzy and felt like he was going to fall.   MRI done in the ED showing acute multiple small right cerebellar infarcts.  Neuro exam nonfocal.      SLP Plan  Continue with current plan of care       Recommendations                   Follow up Recommendations: Skilled Nursing facility;24 hour supervision/assistance SLP Visit Diagnosis: Cognitive communication deficit [id number]) Plan: Continue with current plan of care       Inioluwa Baris I. Hardin Negus, [place], [place] Office number [phone] Pager [place] 04/19/2019, 2:43 PM

## 2019-04-20 ENCOUNTER — Non-Acute Institutional Stay (SKILLED_NURSING_FACILITY): Payer: Medicare Other | Admitting: Internal Medicine

## 2019-04-20 ENCOUNTER — Encounter: Payer: Self-pay | Admitting: Internal Medicine

## 2019-04-20 DIAGNOSIS — I639 Cerebral infarction, unspecified: Secondary | ICD-10-CM

## 2019-04-20 DIAGNOSIS — N184 Chronic kidney disease, stage 4 (severe): Secondary | ICD-10-CM | POA: Diagnosis not present

## 2019-04-20 DIAGNOSIS — I10 Essential (primary) hypertension: Secondary | ICD-10-CM

## 2019-04-20 DIAGNOSIS — E1149 Type 2 diabetes mellitus with other diabetic neurological complication: Secondary | ICD-10-CM | POA: Diagnosis not present

## 2019-04-20 NOTE — Progress Notes (Signed)
This is an acute visit.  Level of care skilled.  [facility]  Chief complaint acute visit status post hospitalization for suspected CVA.  History of present illness.  Patient is a very pleasant 85 year old male here for short-term rehab after hospitalization for what was thought to be an acute CVA.  He has a previous medical history of hypertension and diabetes who presented to the ER with dizziness.  Apparently was mowing the grass and got dizzy and felt like he was going to fall went away but came back again the next morning.  MRI in the hospital showed acute multiple small cerebellar infarcts neurologic exam was nonfocal.  Neurology did see the patient  It was noted 2D echo showed ejection fraction of 50 to 55% with no evidence of embolic source recommendation was to continue with aspirin and Plavix for 3 weeks then continue with aspirin alone.  He is on a statin and LDL was elevated at 153.  He is here for short-term rehab.  Recommendation is to allow permissive hypertension-apparently was not compliant with his blood pressure medication for some time- he was started on Norvasc at discharge with recommendations to titrate this up if needed.  Apparently he also had an episode of acute confusion in the hospital with agitation he required a bedside sitter but this improved by discharge.  It was thought this was most likely hospital delirium  Regards to diabetes CBGs overall apparently were well controlled his CBG this morning was 144 his hemoglobin A1c in the hospital was 6 he did not require insulin or new medication at discharge.  He also has a history of chronic kidney disease stage IV and that apparently was around baseline in the hospital with a creatinine around 2.  Currently he is sitting in his chair comfortably he is actually able to ambulate although is a bit weak he is actually wondering when he can go home he looks to be doing pretty well  Past Medical  History:  Diagnosis Date  . Cataracts, both eyes    Hx: of  . CKD (chronic kidney disease) stage 4, GFR 15-29 ml/min (HCC) 04/15/2019  . CVA (cerebral vascular accident) ([place])   . Diabetes mellitus without complication ([place])    denies history  . Hypertension    not taking medicine recently         Past Surgical History:  Procedure Laterality Date  . CATARACT EXTRACTION W/PHACO Left 11/16/2012   Procedure: CATARACT EXTRACTION PHACO AND INTRAOCULAR LENS PLACEMENT (IOC);  Surgeon: Adonis Brook, MD;  Location: [facility];  Service: Ophthalmology;  Laterality: Left;  . COLONOSCOPY     Hx: of  . HERNIA REPAIR      Social History        Socioeconomic History  . Marital status: Married    Spouse name: Not on file  . Number of children: Not on file  . Years of education: Not on file  . Highest education level: Not on file  Occupational History  . Occupation: retired  Scientific laboratory technician  . Financial resource strain: Not on file  . Food insecurity    Worry: Not on file    Inability: Not on file  . Transportation needs    Medical: Not on file    Non-medical: Not on file  Tobacco Use  . Smoking status: Former Smoker    Quit date: 12/02/1983    Years since quitting: 35.3  . Smokeless tobacco: Never Used  Substance and Sexual Activity  .  Alcohol use: Yes    Alcohol/week: 0.0 standard drinks    Comment: occasional - 2-3 beers on the weekend  . Drug use: No  . Sexual activity: Not on file  Lifestyle  . Physical activity    Days per week: Not on file    Minutes per session: Not on file  . Stress: Not on file  Relationships  . Social Herbalist on phone: Not on file    Gets together: Not on file    Attends religious service: Not on file    Active member of club or organization: Not on file    Attends meetings of clubs or organizations: Not on file    Relationship status: Not on file  . Intimate partner violence    Fear of  current or ex partner: Not on file    Emotionally abused: Not on file    Physically abused: Not on file    Forced sexual activity: Not on file  Other Topics Concern  . Not on file  Social History Narrative  . Not on file    No Known Allergies       Family History  Problem Relation Age of Onset  . Stroke Neg Hx       Medication List    TAKE these medications   acetaminophen 325 MG tablet Commonly known as: TYLENOL Take 2 tablets (650 mg total) by mouth every 6 (six) hours as needed for mild pain (or temp > 37.5 C (99.5 F)).   amLODipine 5 MG tablet Commonly known as: NORVASC Take 1 tablet (5 mg total) by mouth daily.   aspirin 81 MG EC tablet Take 1 tablet (81 mg total) by mouth daily. Start taking on: April 20, 2019   atorvastatin 40 MG tablet Commonly known as: LIPITOR Take 1 tablet (40 mg total) by mouth daily at 6 PM.   clopidogrel 75 MG tablet Commonly known as: PLAVIX Take 1 tablet (75 mg total) by mouth daily for 20 days. Take for 3 weeks then stop Start taking on: April 20, 2019   feeding supplement (ENSURE ENLIVE) Liqd Take 237 mLs by mouth 2 (two) times daily between meals.   senna-docusate 8.6-50 MG tablet Commonly known as: Senokot-S Take 1 tablet by mouth at bedtime as needed for mild constipation.           Review of systems.  In general he is not complaining of any fever or chills.  Skin does not complain of rashes or itching.  Head ears eyes nose mouth and throat no complaints of visual changes or sore throat.  Respiratory does not complain of being short of breath or having cough.  Cardiac does not complain of chest pain or edema.  GI does not complain of any abdominal discomfort nausea vomiting diarrhea constipation.  GU no complaints of dysuria.  Musculoskeletal is not complaining of any joint pain.  Neurologic he is status post CVA but appears to be doing well with this does not complain of  headache or dizziness or syncope.  And psych apparently had some delirium in the hospital continues to be pleasant and appropriate today appears to be in good spirits he does want to go home.  Physical exam.  Temperature is 97.4 pulse 80 respirations 18 blood pressure 118/70 O2 saturation is 98% on room air.  In general this is a very pleasant elderly male in no distress he appears to be well-nourished and in good spirits.  His skin is  warm and dry.  Eyes visual acuity appears to be intact sclera and conjunctive are clear.  Oropharynx is clear mucous membranes moist tongue is midline   Chest is clear to auscultation there is no labored breathing.  Heart is regular rate and rhythm with distant heart sounds radial pulse was regular he does not have significant lower extremity edema.  Do note he does have a listed history of a murmur but this was difficult to hear today  Abdomen soft nontender with positive bowel sounds.  Musculoskeletal is able to stand without assistance and ambulate although is a bit weak  .  Neurologic appears grossly intact his speech is clear could not really appreciate true lateralizing findings.  Psych he is alert and oriented pleasant and appropriate.  Labs.  April 17, 2019.  WBC 7.4 hemoglobin 11.6 platelets 250,000.  Sodium 140 potassium 4.5 BUN 30 creatinine 2.21  April 16, 2019.  Total cholesterol 220 LDL was 153 HDL 44.  April 15, 2019.  TSH 2.569  Assessment and plan.  1.  History of acute CVA-MRI did show multiple small acute infarcts of the right cerebellum.  MRA of the head and neck showed left common coronary artery occlusion at its origin.  Echo did not show evidence of embolic source.  Recommendation was to continue with aspirin and Plavix for 3 weeks then continue with aspirin alone.  Of note he is also on a statin his LDL was elevated in the hospital at 153.  He is here for PT and OT I suspect this stay will be  quite short from what I see today.  2.  Hypertension- recommendation is for somewhat permissive hypertension- apparently was not compliant with blood pressure medications at home.  He was started on Norvasc at the hospital with recommendation to titrate this up if needed-blood pressure today was 118/70 will monitor.  3.  History of metabolic encephalopathy hospital delirium this appears to be of short duration he appears to be at his baseline today he is pleasant and appropriate nursing staff does not report any behaviors.  4.  History of diabetes type 2 hemoglobin A1c was 6 in the hospital-he is not on any medication currently at this point will monitor CBGs CBG was 144 this morning.  5-chronic kidney disease stage IV this appears relatively stable with creatinine hovering around 2--this will be updated on September 21.  Again he will be here for PT and OT vital signs appear to be stable blood pressure appears to be well controlled I suspect his stay here will be quite short.  CPT- [id number]-with no greater than 35 minutes spent assessing patient- reviewing his chart and labs-discussing status with nursing staff- and coordinating and formulating a plan of care- of note greater than 50% of time spent coordinating a plan of care with input as noted above

## 2019-04-21 ENCOUNTER — Non-Acute Institutional Stay (SKILLED_NURSING_FACILITY): Payer: Medicare Other | Admitting: Internal Medicine

## 2019-04-21 ENCOUNTER — Encounter: Payer: Self-pay | Admitting: Internal Medicine

## 2019-04-21 DIAGNOSIS — G9341 Metabolic encephalopathy: Secondary | ICD-10-CM | POA: Diagnosis not present

## 2019-04-21 DIAGNOSIS — I10 Essential (primary) hypertension: Secondary | ICD-10-CM | POA: Diagnosis not present

## 2019-04-21 DIAGNOSIS — E1149 Type 2 diabetes mellitus with other diabetic neurological complication: Secondary | ICD-10-CM | POA: Diagnosis not present

## 2019-04-21 DIAGNOSIS — F0280 Dementia in other diseases classified elsewhere without behavioral disturbance: Secondary | ICD-10-CM

## 2019-04-21 DIAGNOSIS — Z8673 Personal history of transient ischemic attack (TIA), and cerebral infarction without residual deficits: Secondary | ICD-10-CM | POA: Diagnosis not present

## 2019-04-21 DIAGNOSIS — G301 Alzheimer's disease with late onset: Secondary | ICD-10-CM

## 2019-04-21 DIAGNOSIS — E785 Hyperlipidemia, unspecified: Secondary | ICD-10-CM

## 2019-04-21 NOTE — Progress Notes (Signed)
: Provider:  Hennie Duos., MD Location:  [address] [facility] [place] Room Number: 110-P Place of Service:  SNF ([phone])  PCP: Hennie Duos, MD Patient Care Team: Hennie Duos, MD as PCP - General (Internal Medicine)  Extended Emergency Contact Information Primary Emergency Contact: Colclough,Jennett Address: [place] [address] [address] [place] Phone: [phone] Mobile Phone: [phone] Relation: Spouse     Allergies: Patient has no known allergies.  Chief Complaint  Patient presents with   New Admit To SNF    New admission to [facility]     HPI: Patient is an 85 y.o. male with hypertension, and diabetes mellitus who reported feeling "dizzy headed".  Symptoms started several days ago.  He mowed the grass and suddenly got dizzy and thought he was going to fall.  MRI done in the ED showed multiple small cerebellar infarcts neuro exam was nonfocal.  Patient was admitted to [facility] for 9/11-16 she is able to.  Patient was seen by neurology and started on dual platelet therapy.  2D echo showed no evidence of embolic source MRA of the neck significant for left common carotid artery occlusion at its origin.  Patient total course was complicated by acute metabolic encephalopathy and delirium which improved.  Patient is admitted to skilled nursing facility for OT/PT.  While at skilled nursing facility patient will be followed for hyperlipidemia treated with Lipitor, diabetes mellitus treated with diet alone and hypertension treated with Norvasc.  Past Medical History:  Diagnosis Date   Cataracts, both eyes    Hx: of   CKD (chronic kidney disease) stage 4, GFR 15-29 ml/min ([place]) 04/15/2019   CVA (cerebral vascular accident) ([place])    Diabetes mellitus without complication ([place])    denies history   Hypertension    not taking medicine recently    Past Surgical History:  Procedure Laterality Date   CATARACT  EXTRACTION W/PHACO Left 11/16/2012   Procedure: CATARACT EXTRACTION PHACO AND INTRAOCULAR LENS PLACEMENT ([place]);  Surgeon: Adonis Brook, MD;  Location: [place];  Service: Ophthalmology;  Laterality: Left;   COLONOSCOPY     Hx: of   HERNIA REPAIR      Allergies as of 04/21/2019   No Known Allergies     Medication List       Accurate as of April 21, 2019 11:32 AM. If you have any questions, ask your nurse or doctor.        acetaminophen 325 MG tablet Commonly known as: TYLENOL Take 2 tablets (650 mg total) by mouth every 6 (six) hours as needed for mild pain (or temp > 37.5 C (99.5 F)).   amLODipine 5 MG tablet Commonly known as: NORVASC Take 1 tablet (5 mg total) by mouth daily.   aspirin 81 MG EC tablet Take 1 tablet (81 mg total) by mouth daily.   atorvastatin 40 MG tablet Commonly known as: LIPITOR Take 1 tablet (40 mg total) by mouth daily at 6 PM.   clopidogrel 75 MG tablet Commonly known as: PLAVIX Take 1 tablet (75 mg total) by mouth daily for 20 days. Take for 3 weeks then stop   feeding supplement (ENSURE ENLIVE) Liqd Take 237 mLs by mouth 2 (two) times daily between meals.   senna-docusate 8.6-50 MG tablet Commonly known as: Senokot-S Take 1 tablet by mouth at bedtime as needed for mild constipation.       No  orders of the defined types were placed in this encounter.    There is no immunization history on file for this patient.  Social History   Tobacco Use   Smoking status: Former Smoker    Quit date: 12/02/1983    Years since quitting: 35.4   Smokeless tobacco: Never Used  Substance Use Topics   Alcohol use: Yes    Alcohol/week: 0.0 standard drinks    Comment: occasional - 2-3 beers on the weekend    Family history is   Family History  Problem Relation Age of Onset   Stroke Neg Hx       Review of Systems  DATA OBTAINED: from patient, nurse GENERAL:  no fevers, fatigue, appetite changes SKIN: No itching, or rash EYES: No eye  pain, redness, discharge EARS: No earache, tinnitus, change in hearing NOSE: No congestion, drainage or bleeding  MOUTH/THROAT: No mouth or tooth pain, No sore throat RESPIRATORY: No cough, wheezing, SOB CARDIAC: No chest pain, palpitations, lower extremity edema  GI: No abdominal pain, No N/V/D or constipation, No heartburn or reflux  GU: No dysuria, frequency or urgency, or incontinence  MUSCULOSKELETAL: No unrelieved bone/joint pain NEUROLOGIC: No headache, dizziness or focal weakness PSYCHIATRIC: No c/o anxiety or sadness   Vitals:   04/21/19 1106  BP: (!) 147/85  Pulse: 92  Resp: 18  Temp: 97.7 F (36.5 C)  SpO2: 98%    SpO2 Readings from Last 1 Encounters:  04/21/19 98%   Body mass index is 26.95 kg/m.     Physical Exam  GENERAL APPEARANCE: Alert, conversant,  No acute distress.  SKIN: No diaphoresis rash HEAD: Normocephalic, atraumatic  EYES: Conjunctiva/lids clear. Pupils round, reactive. EOMs intact.  EARS: External exam WNL, canals clear. Hearing grossly normal.  NOSE: No deformity or discharge.  MOUTH/THROAT: Lips w/o lesions  RESPIRATORY: Breathing is even, unlabored. Lung sounds are clear   CARDIOVASCULAR: Heart RRR no murmurs, rubs or gallops. No peripheral edema.   GASTROINTESTINAL: Abdomen is soft, non-tender, not distended w/ normal bowel sounds. GENITOURINARY: Bladder non tender, not distended  MUSCULOSKELETAL: No abnormal joints or musculature NEUROLOGIC:  Cranial nerves 2-12 grossly intact. Moves all extremities  PSYCHIATRIC: Mood and affect with dementia, no behavioral issues  Patient Active Problem List   Diagnosis Date Noted   Acute CVA (cerebrovascular accident) ([place]) 04/15/2019   Essential hypertension 04/15/2019   Diabetes with neurologic complications ([place]) [id number]   CKD (chronic kidney disease) stage 4, GFR 15-29 ml/min ([facility]) 04/15/2019   CVA (cerebral vascular accident) [facility])       Labs reviewed: Basic Metabolic Panel:     Component Value Date/Time   NA 140 04/17/2019 0712   K 4.5 04/17/2019 0712   CL 112 (H) 04/17/2019 0712   CO2 17 (L) 04/17/2019 0712   GLUCOSE 96 04/17/2019 0712   BUN 30 (H) 04/17/2019 0712   CREATININE 2.21 (H) 04/17/2019 0712   CALCIUM 9.2 04/17/2019 0712   PROT 7.5 04/14/2019 1759   ALBUMIN 3.7 04/14/2019 1759   AST 17 04/14/2019 1759   ALT 10 04/14/2019 1759   ALKPHOS 88 04/14/2019 1759   BILITOT 0.4 04/14/2019 1759   GFRNONAA 27 (L) 04/17/2019 0712   GFRAA 31 (L) 04/17/2019 0712    Recent Labs    04/14/19 1759 04/17/19 0712  NA 139 140  K 4.9 4.5  CL 110 112*  CO2 19* 17*  GLUCOSE 134* 96  BUN 36* 30*  CREATININE 2.48* 2.21*  CALCIUM 9.4 9.2   Liver  Function Tests: Recent Labs    04/14/19 1759  AST 17  ALT 10  ALKPHOS 88  BILITOT 0.4  PROT 7.5  ALBUMIN 3.7   Recent Labs    04/14/19 1759  LIPASE 31   No results for input(s): AMMONIA in the last 8760 hours. CBC: Recent Labs    04/14/19 1759 04/17/19 0712  WBC 6.6 7.4  HGB 11.1* 11.6*  HCT 34.8* 35.6*  MCV 90.4 88.6  PLT 224 250   Lipid Recent Labs    04/16/19 0359  CHOL 220*  HDL 44  LDLCALC 153*  TRIG 113    Cardiac Enzymes: No results for input(s): CKTOTAL, CKMB, CKMBINDEX, TROPONINI in the last 8760 hours. BNP: No results for input(s): BNP in the last 8760 hours. No results found for: [facility] Lab Results  Component Value Date   HGBA1C 6.0 (H) 04/15/2019   Lab Results  Component Value Date   TSH 2.569 04/15/2019   Lab Results  Component Value Date   VITAMINB12 410 01/21/2010   Lab Results  Component Value Date   FOLATE  01/21/2010    6.1 (NOTE)  Reference Ranges        Deficient:       0.4 - 3.3 ng/mL        Indeterminate:   3.4 - 5.4 ng/mL        Normal:              > 5.4 ng/mL   Lab Results  Component Value Date   IRON 41 (L) 01/21/2010   FERRITIN 758 (H) 01/21/2010    Imaging and Procedures obtained prior to SNF admission: Mr Angio Head Wo  Contrast  Addendum Date: 04/15/2019   ADDENDUM REPORT: 04/15/2019 15:35 ADDENDUM: Study discussed by telephone with Dr. Karmen Bongo on 04/15/2019 at 1519 hours. Electronically Signed   By: Genevie Ann M.D.   On: 04/15/2019 15:35   Result Date: 04/15/2019 CLINICAL DATA:  85 year old male with dizziness found to have multiple small acute infarcts in the right cerebellum on MRI earlier today. EXAM: MRA HEAD WITHOUT CONTRAST MRA NECK WITHOUT CONTRAST TECHNIQUE: Angiographic images of the Circle of Willis were obtained using MRA technique without intravenous contrast. Angiographic images of the neck were obtained using MRA technique without intravenous contrast. Carotid stenosis measurements (when applicable) are obtained utilizing NASCET criteria, using the distal internal carotid diameter as the denominator. COMPARISON:  Brain MRI earlier today. FINDINGS: MRA NECK FINDINGS Time-of-flight images demonstrate a 3 vessel arch configuration with occlusion of the left common carotid artery at its origin (series 4, image 148). Antegrade flow in the right CCA and both subclavian arteries. Antegrade flow continues in the right carotid through the right carotid bifurcation and in the ICA to the skull base. Mild irregularity occurs at the right ICA origin and bulb with less than 50 % stenosis with respect to the distal vessel. Absent antegrade flow throughout the left carotid to the skull base. No proximal right subclavian artery or right vertebral artery origin stenosis suspected. The right vertebral is patent to the skull base without stenosis. No proximal left subclavian or left vertebral artery origin stenosis. The left vertebral is patent with antegrade flow to the skull base. Grossly negative visible neck and upper chest. MRA HEAD FINDINGS No intracranial mass effect or ventriculomegaly. Antegrade flow in the posterior circulation. The distal vertebral arteries appear codominant and are patent to the vertebrobasilar  junction. Normal right PICA origin. The left PICA has a proximal origin which  is patent. Patent vertebrobasilar junction and basilar artery. Patent SCA and PCA origins with prominent bilateral posterior communicating arteries. The right PCA has a fetal type origin. Bilateral PCA branches are within normal limits. Absent flow signal in the left ICA siphon until the level of the ophthalmic and posterior communicating artery origins. Reconstituted flow signal in the distal left ICA to the terminus. The left MCA and ACA origins are normal. There is only faint asymmetrically decreased flow signal in the left MCA M1 compared to the right. The left MCA bifurcation seems to be patent, but there is asymmetrically decreased flow signal in the left M2 and M3 branches compared to the right. Subtle decreased flow in the left A1 compared to the right. The anterior communicating artery and ACA A2 segments are within normal limits. Antegrade flow in the right ICA siphon to the terminus without stenosis. Normal right posterior communicating artery origin. Normal right MCA and ACA origins. The right M1 and MCA bifurcation are patent. Visible MCA branches are within normal limits. IMPRESSION: 1. 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 No reconstituted flow until just before the left ICA terminus, which appears supplied by the left ophthalmic and posterior communicating arteries. 2. The left MCA and ACA remain patent although with varying degrees of asymmetrically decreased flow signal compared to the right side vessels. 3. Cervical right ICA atherosclerosis without significant stenosis. 4. No posterior circulation abnormality identified to correspond with the small right cerebellar infarcts seen on MRI earlier today. Electronically Signed: By: Genevie Ann M.D. On: 04/15/2019 15:14   Mr Angio Neck Wo Contrast  Addendum Date: 04/15/2019   ADDENDUM REPORT: 04/15/2019 15:35 ADDENDUM: Study discussed by  telephone with Dr. Karmen Bongo on 04/15/2019 at 1519 hours. Electronically Signed   By: Genevie Ann M.D.   On: 04/15/2019 15:35   Result Date: 04/15/2019 CLINICAL DATA:  85 year old male with dizziness found to have multiple small acute infarcts in the right cerebellum on MRI earlier today. EXAM: MRA HEAD WITHOUT CONTRAST MRA NECK WITHOUT CONTRAST TECHNIQUE: Angiographic images of the Circle of Willis were obtained using MRA technique without intravenous contrast. Angiographic images of the neck were obtained using MRA technique without intravenous contrast. Carotid stenosis measurements (when applicable) are obtained utilizing NASCET criteria, using the distal internal carotid diameter as the denominator. COMPARISON:  Brain MRI earlier today. FINDINGS: MRA NECK FINDINGS Time-of-flight images demonstrate a 3 vessel arch configuration with occlusion of the left common carotid artery at its origin (series 4, image 148). Antegrade flow in the right CCA and both subclavian arteries. Antegrade flow continues in the right carotid through the right carotid bifurcation and in the ICA to the skull base. Mild irregularity occurs at the right ICA origin and bulb with less than 50 % stenosis with respect to the distal vessel. Absent antegrade flow throughout the left carotid to the skull base. No proximal right subclavian artery or right vertebral artery origin stenosis suspected. The right vertebral is patent to the skull base without stenosis. No proximal left subclavian or left vertebral artery origin stenosis. The left vertebral is patent with antegrade flow to the skull base. Grossly negative visible neck and upper chest. MRA HEAD FINDINGS No intracranial mass effect or ventriculomegaly. Antegrade flow in the posterior circulation. The distal vertebral arteries appear codominant and are patent to the vertebrobasilar junction. Normal right PICA origin. The left PICA has a proximal origin which is patent. Patent  vertebrobasilar junction and basilar artery. Patent  SCA and PCA origins with prominent bilateral posterior communicating arteries. The right PCA has a fetal type origin. Bilateral PCA branches are within normal limits. Absent flow signal in the left ICA siphon until the level of the ophthalmic and posterior communicating artery origins. Reconstituted flow signal in the distal left ICA to the terminus. The left MCA and ACA origins are normal. There is only faint asymmetrically decreased flow signal in the left MCA M1 compared to the right. The left MCA bifurcation seems to be patent, but there is asymmetrically decreased flow signal in the left M2 and M3 branches compared to the right. Subtle decreased flow in the left A1 compared to the right. The anterior communicating artery and ACA A2 segments are within normal limits. Antegrade flow in the right ICA siphon to the terminus without stenosis. Normal right posterior communicating artery origin. Normal right MCA and ACA origins. The right M1 and MCA bifurcation are patent. Visible MCA branches are within normal limits. IMPRESSION: 1. 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 No reconstituted flow until just before the left ICA terminus, which appears supplied by the left ophthalmic and posterior communicating arteries. 2. The left MCA and ACA remain patent although with varying degrees of asymmetrically decreased flow signal compared to the right side vessels. 3. Cervical right ICA atherosclerosis without significant stenosis. 4. No posterior circulation abnormality identified to correspond with the small right cerebellar infarcts seen on MRI earlier today. Electronically Signed: By: Genevie Ann M.D. On: 04/15/2019 15:14   Mr Brain Wo Contrast  Result Date: 04/15/2019 CLINICAL DATA:  Dizziness for 3-4 days. EXAM: MRI HEAD WITHOUT CONTRAST TECHNIQUE: Multiplanar, multiecho pulse sequences of the brain and surrounding structures were  obtained without intravenous contrast. COMPARISON:  None. FINDINGS: BRAIN: There are multiple small acute infarcts of the right cerebellar hemisphere. No acute ischemia elsewhere in the brain. Multifocal white matter hyperintensity, most commonly due to chronic ischemic microangiopathy. There is generalized atrophy without lobar predilection. The midline structures are normal. There is an old left cerebellar infarct. VASCULAR: The major intracranial arterial and venous sinus flow voids are normal. Single focus of chronic microhemorrhage in the left hemisphere no acute hemorrhage. SKULL AND UPPER CERVICAL SPINE: Calvarial bone marrow signal is normal. There is no skull base mass. The visualized upper cervical spine and soft tissues are normal. SINUSES/ORBITS: There are no fluid levels or advanced mucosal thickening. The mastoid air cells and middle ear cavities are free of fluid. The orbits are normal. IMPRESSION: 1. 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 2. No hemorrhage or mass effect. 3. Atrophy and chronic microvascular ischemia. Electronically Signed   By: Ulyses Jarred M.D.   On: 04/15/2019 04:48     Not all labs, radiology exams or other studies done during hospitalization come through on my EPIC note; however they are reviewed by me.    Assessment and Plan  Acute cerebellar CVA- MRI head showed multiple small acute infarcts of the right cerebellum; MRA head and neck significant for left common carotid artery occlusion in his origin; 2D echo with EF 50 to 55%, no evidence of embolic source LDL elevated at 153, patient on statin; ASA and Plavix for 3 weeks then aspirin alone SNF-admitted for OT/PT; continue with ASA 81 mg daily and Plavix for 75 mg daily for 20 days then stop and continue ASA 81 mg daily  Acute metabolic encephalopathy/hospital delirium- resolved SNF-supportive care  Hypertension SNF- continue Norvasc 5 mg daily  and titrate slowly for  elevations  Hyperlipidemia-LDL 154 SNF- continue Lipitor 40 mg daily  Diabetes mellitus SNF-controlled on diet alone; will follow blood sugars daily  Dementia SNF- new diagnosis; patient scored 11 out of 30 on B IMS; continue supportive care   Time spent greater than 45 minutes;> 50% of time with patient was spent reviewing records, labs, tests and studies, counseling and developing plan of care  Hennie Duos, MD

## 2019-04-22 ENCOUNTER — Encounter: Payer: Self-pay | Admitting: Internal Medicine

## 2019-04-22 DIAGNOSIS — F0390 Unspecified dementia without behavioral disturbance: Secondary | ICD-10-CM | POA: Insufficient documentation

## 2019-04-22 DIAGNOSIS — G9341 Metabolic encephalopathy: Secondary | ICD-10-CM | POA: Insufficient documentation

## 2019-04-22 DIAGNOSIS — E785 Hyperlipidemia, unspecified: Secondary | ICD-10-CM | POA: Insufficient documentation

## 2019-04-24 LAB — BASIC METABOLIC PANEL
BUN: 48 — AB (ref 4–21)
Creatinine: 2.7 — AB (ref 0.6–1.3)
Glucose: 84
Potassium: 5.6 — AB (ref 3.4–5.3)
Sodium: 137 (ref 137–147)

## 2019-04-24 LAB — CBC AND DIFFERENTIAL
HCT: 34 — AB (ref 41–53)
Hemoglobin: 11.1 — AB (ref 13.5–17.5)
Neutrophils Absolute: 3
Platelets: 237 (ref 150–399)
WBC: 5.8

## 2019-04-25 ENCOUNTER — Non-Acute Institutional Stay (SKILLED_NURSING_FACILITY): Payer: Medicare Other | Admitting: Internal Medicine

## 2019-04-25 DIAGNOSIS — R638 Other symptoms and signs concerning food and fluid intake: Secondary | ICD-10-CM | POA: Diagnosis not present

## 2019-04-25 DIAGNOSIS — N179 Acute kidney failure, unspecified: Secondary | ICD-10-CM

## 2019-04-25 DIAGNOSIS — E875 Hyperkalemia: Secondary | ICD-10-CM

## 2019-04-26 ENCOUNTER — Non-Acute Institutional Stay (SKILLED_NURSING_FACILITY): Payer: Medicare Other | Admitting: Internal Medicine

## 2019-04-26 ENCOUNTER — Encounter: Payer: Self-pay | Admitting: Internal Medicine

## 2019-04-26 DIAGNOSIS — E1149 Type 2 diabetes mellitus with other diabetic neurological complication: Secondary | ICD-10-CM

## 2019-04-26 DIAGNOSIS — N184 Chronic kidney disease, stage 4 (severe): Secondary | ICD-10-CM

## 2019-04-26 DIAGNOSIS — I10 Essential (primary) hypertension: Secondary | ICD-10-CM

## 2019-04-26 DIAGNOSIS — E875 Hyperkalemia: Secondary | ICD-10-CM | POA: Diagnosis not present

## 2019-04-26 DIAGNOSIS — Z8673 Personal history of transient ischemic attack (TIA), and cerebral infarction without residual deficits: Secondary | ICD-10-CM

## 2019-04-26 NOTE — Progress Notes (Signed)
Location:  [facility] Room Number: 110-P Place of Service:  SNF (31)  Kenneth Duos, MD  Patient Care Team: Kenneth Duos, MD as PCP - General (Internal Medicine)  Extended Emergency Contact Information Primary Emergency Contact: Jaber,Jennett Address: [place] [address] [address] [place] Phone: [phone] Mobile Phone: [phone] Relation: Spouse    Allergies: Patient has no known allergies.  Chief Complaint  Patient presents with  . Acute Visit    Patient has hyperkalemia (potassium of 5.6)    HPI: Patient is an 85 y.o. male who is being seen because his creatinine is risen on a routine lab from 1.5 to 2.65 and his potassium is abnormal at 5.6 with a CO2 of 19.  Per nursing staff patient always drinks what ever he is given but does not ask for fluid.  Past Medical History:  Diagnosis Date  . Cataracts, both eyes    Hx: of  . CKD (chronic kidney disease) stage 4, GFR 15-29 ml/min (HCC) 04/15/2019  . CVA (cerebral vascular accident) ([place])   . Diabetes mellitus without complication ([place])    denies history  . Hypertension    not taking medicine recently    Past Surgical History:  Procedure Laterality Date  . CATARACT EXTRACTION W/PHACO Left 11/16/2012   Procedure: CATARACT EXTRACTION PHACO AND INTRAOCULAR LENS PLACEMENT (IOC);  Surgeon: Adonis Brook, MD;  Location: [facility];  Service: Ophthalmology;  Laterality: Left;  . COLONOSCOPY     Hx: of  . HERNIA REPAIR      Allergies as of 04/25/2019   No Known Allergies     Medication List       Accurate as of April 25, 2019 11:59 PM. If you have any questions, ask your nurse or doctor.        acetaminophen 325 MG tablet Commonly known as: TYLENOL Take 2 tablets (650 mg total) by mouth every 6 (six) hours as needed for mild pain (or temp > 37.5 C (99.5 F)).   amLODipine 5 MG tablet Commonly known as: NORVASC Take 1 tablet (5 mg  total) by mouth daily.   aspirin 81 MG EC tablet Take 1 tablet (81 mg total) by mouth daily.   atorvastatin 40 MG tablet Commonly known as: LIPITOR Take 1 tablet (40 mg total) by mouth daily at 6 PM.   carboxymethylcellulose 1 % ophthalmic solution 2 drops 4 (four) times daily as needed (Dry eyes).   clopidogrel 75 MG tablet Commonly known as: PLAVIX Take 1 tablet (75 mg total) by mouth daily for 20 days. Take for 3 weeks then stop   feeding supplement (ENSURE ENLIVE) Liqd Take 237 mLs by mouth 2 (two) times daily between meals.   senna-docusate 8.6-50 MG tablet Commonly known as: Senokot-S Take 1 tablet by mouth at bedtime as needed for mild constipation.       No orders of the defined types were placed in this encounter.    There is no immunization history on file for this patient.  Social History   Tobacco Use  . Smoking status: Former Smoker    Quit date: 12/02/1983    Years since quitting: 35.4  . Smokeless tobacco: Never Used  Substance Use Topics  . Alcohol use: Yes    Alcohol/week: 0.0 standard drinks    Comment: occasional - 2-3 beers on the weekend    Review of Systems  DATA OBTAINED: from  patient, nurse GENERAL:  no fevers, fatigue, appetite changes SKIN: No itching, rash HEENT: No complaint RESPIRATORY: No cough, wheezing, SOB CARDIAC: No chest pain, palpitations, lower extremity edema  GI: No abdominal pain, No N/V/D or constipation, No heartburn or reflux  GU: No dysuria, frequency or urgency, or incontinence  MUSCULOSKELETAL: No unrelieved bone/joint pain NEUROLOGIC: No headache, dizziness  PSYCHIATRIC: No overt anxiety or sadness  Vitals:   04/26/19 1108  BP: 120/73  Pulse: 82  Resp: 17  Temp: (!) 97.2 F (36.2 C)   Body mass index is 26.95 kg/m. Physical Exam  GENERAL APPEARANCE: Alert, conversant, No acute distress  SKIN: No diaphoresis rash HEENT: Unremarkable RESPIRATORY: Breathing is even, unlabored. Lung sounds are clear    CARDIOVASCULAR: Heart RRR no murmurs, rubs or gallops. No peripheral edema  GASTROINTESTINAL: Abdomen is soft, non-tender, not distended w/ normal bowel sounds.  GENITOURINARY: Bladder non tender, not distended  MUSCULOSKELETAL: No abnormal joints or musculature NEUROLOGIC: Cranial nerves 2-12 grossly intact. Moves all extremities PSYCHIATRIC: Mood and affect with dementia, no behavioral issues  Patient Active Problem List   Diagnosis Date Noted  . Acute metabolic encephalopathy [id number]  . Hyperlipidemia 04/22/2019  . Dementia without behavioral disturbance ([place]) 04/22/2019  . Acute CVA (cerebrovascular accident) ([place]) 04/15/2019  . Essential hypertension 04/15/2019  . 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 CKD (chronic kidney disease) stage 4, GFR 15-29 ml/min (HCC) 04/15/2019  . Cerebellar cerebrovascular accident (CVA) without late effect     CMP     Component Value Date/Time   NA 137 04/24/2019   K 5.6 (A) 04/24/2019   CL 112 (H) 04/17/2019 0712   CO2 17 (L) 04/17/2019 0712   GLUCOSE 96 04/17/2019 0712   BUN 48 (A) 04/24/2019   CREATININE 2.7 (A) 04/24/2019   CREATININE 2.21 (H) 04/17/2019 0712   CALCIUM 9.2 04/17/2019 0712   PROT 7.5 04/14/2019 1759   ALBUMIN 3.7 04/14/2019 1759   AST 17 04/14/2019 1759   ALT 10 04/14/2019 1759   ALKPHOS 88 04/14/2019 1759   BILITOT 0.4 04/14/2019 1759   GFRNONAA 27 (L) 04/17/2019 0712   GFRAA 31 (L) 04/17/2019 0712   Recent Labs    04/14/19 1759 04/17/19 0712 04/24/19  NA 139 140 137  K 4.9 4.5 5.6*  CL 110 112*  --   CO2 19* 17*  --   GLUCOSE 134* 96  --   BUN 36* 30* 48*  CREATININE 2.48* 2.21* 2.7*  CALCIUM 9.4 9.2  --    Recent Labs    04/14/19 1759  AST 17  ALT 10  ALKPHOS 88  BILITOT 0.4  PROT 7.5  ALBUMIN 3.7   Recent Labs    04/14/19 1759 04/17/19 0712 04/24/19  WBC 6.6 7.4 5.8  NEUTROABS  --   --  3  HGB 11.1* 11.6* 11.1*  HCT 34.8* 35.6* 34*  MCV 90.4 88.6  --   PLT 224  250 237   Recent Labs    04/16/19 0359  CHOL 220*  LDLCALC 153*  TRIG 113   No results found for: [facility] Lab Results  Component Value Date   TSH 2.569 04/15/2019   Lab Results  Component Value Date   HGBA1C 6.0 (H) 04/15/2019   Lab Results  Component Value Date   CHOL 220 (H) 04/16/2019   HDL 44 04/16/2019   LDLCALC 153 (H) 04/16/2019   TRIG 113 04/16/2019   CHOLHDL 5.0 04/16/2019    Significant Diagnostic Results in last  30 days:  Mr Angio Head Wo Contrast  Addendum Date: 04/15/2019   ADDENDUM REPORT: 04/15/2019 15:35 ADDENDUM: Study discussed by telephone with Dr. Karmen Bongo on 04/15/2019 at 1519 hours. Electronically Signed   By: Genevie Ann M.D.   On: 04/15/2019 15:35   Result Date: 04/15/2019 CLINICAL DATA:  85 year old male with dizziness found to have multiple small acute infarcts in the right cerebellum on MRI earlier today. EXAM: MRA HEAD WITHOUT CONTRAST MRA NECK WITHOUT CONTRAST TECHNIQUE: Angiographic images of the Circle of Willis were obtained using MRA technique without intravenous contrast. Angiographic images of the neck were obtained using MRA technique without intravenous contrast. Carotid stenosis measurements (when applicable) are obtained utilizing NASCET criteria, using the distal internal carotid diameter as the denominator. COMPARISON:  Brain MRI earlier today. FINDINGS: MRA NECK FINDINGS Time-of-flight images demonstrate a 3 vessel arch configuration with occlusion of the left common carotid artery at its origin (series 4, image 148). Antegrade flow in the right CCA and both subclavian arteries. Antegrade flow continues in the right carotid through the right carotid bifurcation and in the ICA to the skull base. Mild irregularity occurs at the right ICA origin and bulb with less than 50 % stenosis with respect to the distal vessel. Absent antegrade flow throughout the left carotid to the skull base. No proximal right subclavian artery or right vertebral  artery origin stenosis suspected. The right vertebral is patent to the skull base without stenosis. No proximal left subclavian or left vertebral artery origin stenosis. The left vertebral is patent with antegrade flow to the skull base. Grossly negative visible neck and upper chest. MRA HEAD FINDINGS No intracranial mass effect or ventriculomegaly. Antegrade flow in the posterior circulation. The distal vertebral arteries appear codominant and are patent to the vertebrobasilar junction. Normal right PICA origin. The left PICA has a proximal origin which is patent. Patent vertebrobasilar junction and basilar artery. Patent SCA and PCA origins with prominent bilateral posterior communicating arteries. The right PCA has a fetal type origin. Bilateral PCA branches are within normal limits. Absent flow signal in the left ICA siphon until the level of the ophthalmic and posterior communicating artery origins. Reconstituted flow signal in the distal left ICA to the terminus. The left MCA and ACA origins are normal. There is only faint asymmetrically decreased flow signal in the left MCA M1 compared to the right. The left MCA bifurcation seems to be patent, but there is asymmetrically decreased flow signal in the left M2 and M3 branches compared to the right. Subtle decreased flow in the left A1 compared to the right. The anterior communicating artery and ACA A2 segments are within normal limits. Antegrade flow in the right ICA siphon to the terminus without stenosis. Normal right posterior communicating artery origin. Normal right MCA and ACA origins. The right M1 and MCA bifurcation are patent. Visible MCA branches are within normal limits. IMPRESSION: 1. 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 No reconstituted flow until just before the left ICA terminus, which appears supplied by the left ophthalmic and posterior communicating arteries. 2. The left MCA and ACA remain patent although with  varying degrees of asymmetrically decreased flow signal compared to the right side vessels. 3. Cervical right ICA atherosclerosis without significant stenosis. 4. No posterior circulation abnormality identified to correspond with the small right cerebellar infarcts seen on MRI earlier today. Electronically Signed: By: Genevie Ann M.D. On: 04/15/2019 15:14   Mr Angio Neck Wo Contrast  Addendum Date: 04/15/2019   ADDENDUM REPORT: 04/15/2019 15:35 ADDENDUM: Study discussed by telephone with Dr. Karmen Bongo on 04/15/2019 at 1519 hours. Electronically Signed   By: Genevie Ann M.D.   On: 04/15/2019 15:35   Result Date: 04/15/2019 CLINICAL DATA:  85 year old male with dizziness found to have multiple small acute infarcts in the right cerebellum on MRI earlier today. EXAM: MRA HEAD WITHOUT CONTRAST MRA NECK WITHOUT CONTRAST TECHNIQUE: Angiographic images of the Circle of Willis were obtained using MRA technique without intravenous contrast. Angiographic images of the neck were obtained using MRA technique without intravenous contrast. Carotid stenosis measurements (when applicable) are obtained utilizing NASCET criteria, using the distal internal carotid diameter as the denominator. COMPARISON:  Brain MRI earlier today. FINDINGS: MRA NECK FINDINGS Time-of-flight images demonstrate a 3 vessel arch configuration with occlusion of the left common carotid artery at its origin (series 4, image 148). Antegrade flow in the right CCA and both subclavian arteries. Antegrade flow continues in the right carotid through the right carotid bifurcation and in the ICA to the skull base. Mild irregularity occurs at the right ICA origin and bulb with less than 50 % stenosis with respect to the distal vessel. Absent antegrade flow throughout the left carotid to the skull base. No proximal right subclavian artery or right vertebral artery origin stenosis suspected. The right vertebral is patent to the skull base without stenosis. No proximal  left subclavian or left vertebral artery origin stenosis. The left vertebral is patent with antegrade flow to the skull base. Grossly negative visible neck and upper chest. MRA HEAD FINDINGS No intracranial mass effect or ventriculomegaly. Antegrade flow in the posterior circulation. The distal vertebral arteries appear codominant and are patent to the vertebrobasilar junction. Normal right PICA origin. The left PICA has a proximal origin which is patent. Patent vertebrobasilar junction and basilar artery. Patent SCA and PCA origins with prominent bilateral posterior communicating arteries. The right PCA has a fetal type origin. Bilateral PCA branches are within normal limits. Absent flow signal in the left ICA siphon until the level of the ophthalmic and posterior communicating artery origins. Reconstituted flow signal in the distal left ICA to the terminus. The left MCA and ACA origins are normal. There is only faint asymmetrically decreased flow signal in the left MCA M1 compared to the right. The left MCA bifurcation seems to be patent, but there is asymmetrically decreased flow signal in the left M2 and M3 branches compared to the right. Subtle decreased flow in the left A1 compared to the right. The anterior communicating artery and ACA A2 segments are within normal limits. Antegrade flow in the right ICA siphon to the terminus without stenosis. Normal right posterior communicating artery origin. Normal right MCA and ACA origins. The right M1 and MCA bifurcation are patent. Visible MCA branches are within normal limits. IMPRESSION: 1. 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 No reconstituted flow until just before the left ICA terminus, which appears supplied by the left ophthalmic and posterior communicating arteries. 2. The left MCA and ACA remain patent although with varying degrees of asymmetrically decreased flow signal compared to the right side vessels. 3. Cervical right  ICA atherosclerosis without significant stenosis. 4. No posterior circulation abnormality identified to correspond with the small right cerebellar infarcts seen on MRI earlier today. Electronically Signed: By: Genevie Ann M.D. On: 04/15/2019 15:14   Mr Brain Wo Contrast  Result Date: 04/15/2019 CLINICAL DATA:  Dizziness for 3-4 days.  EXAM: MRI HEAD WITHOUT CONTRAST TECHNIQUE: Multiplanar, multiecho pulse sequences of the brain and surrounding structures were obtained without intravenous contrast. COMPARISON:  None. FINDINGS: BRAIN: There are multiple small acute infarcts of the right cerebellar hemisphere. No acute ischemia elsewhere in the brain. Multifocal white matter hyperintensity, most commonly due to chronic ischemic microangiopathy. There is generalized atrophy without lobar predilection. The midline structures are normal. There is an old left cerebellar infarct. VASCULAR: The major intracranial arterial and venous sinus flow voids are normal. Single focus of chronic microhemorrhage in the left hemisphere no acute hemorrhage. SKULL AND UPPER CERVICAL SPINE: Calvarial bone marrow signal is normal. There is no skull base mass. The visualized upper cervical spine and soft tissues are normal. SINUSES/ORBITS: There are no fluid levels or advanced mucosal thickening. The mastoid air cells and middle ear cavities are free of fluid. The orbits are normal. IMPRESSION: 1. 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 2. No hemorrhage or mass effect. 3. Atrophy and chronic microvascular ischemia. Electronically Signed   By: Ulyses Jarred M.D.   On: 04/15/2019 04:48    Assessment and Plan  Acute renal failure, secondary to poor p.o. intake/hyperkalemia- because patient takes p.o.'s well and given will start the regimen of 120 cc every 2 hours x8 times in a day while awake for the next at least 5 days for elevated creatinine; for hyperkalemia will treat with Kayexalate 30 g now and 30 g in 4 hours; BMP  tomorrow to recheck potassium and BMP on Monday 9/28     Kenneth Duos, MD

## 2019-04-26 NOTE — Progress Notes (Signed)
Location:  [facility] Room Number: 110-P Place of Service:  SNF [phone]) Provider:  Granville Lewis, PA-C  Hennie Duos, MD  Patient Care Team: Hennie Duos, MD as PCP - General (Internal Medicine)  Extended Emergency Contact Information Primary Emergency Contact: Beals,Jennett Address: [address]          [place] [address] [place] Phone: [phone] Mobile Phone: [phone] Relation: Spouse  Code Status:  DNR Goals of care: Advanced Directive information Advanced Directives 04/26/2019  Does Patient Have a Medical Advance Directive? Yes  Type of Advance Directive Out of facility DNR (pink MOST or yellow form)  Does patient want to make changes to medical advance directive? No - Patient declined  Would patient like information on creating a medical advance directive? -  Pre-existing out of facility DNR order (yellow form or pink MOST form) Yellow form placed in chart (order not valid for inpatient use)     Chief Complaint  Patient presents with  . Acute Visit    Patient is seen for followup of hyperkalemia     HPI:  Pt is an 85 y.o. male seen today for an acute visit for follow-up of hypokalemia on a recent lab.  Patient is here for short-term rehab after hospitalization for what was thought to be an acute CVA.  He also has a history of hypertension as well as diabetes.  He appears to be doing well here he is looking forward to going home nursing does not report any recent acute issues.  His diabetes appears to be diet-controlled  Blood pressure appears stable on Norvasc 5 mg a day recent blood pressures 120/73 123/73.  He does have a history of chronic kidney disease with baseline creatinine in the twos updated lab earlier this week showed a creatinine of 2.65 and a BUN of 48.1- potassium was elevated at 5.6.  Dr. Sheppard Coil did address this and gave Kayexalate x2 doses.  Today he is lying in bed comfortably he gets  up and about it appears quite well he has no complaints of palpitations chest pain shortness of breath   Appears doing well and nursing concurs with this    Past Medical History:  Diagnosis Date  . Cataracts, both eyes    Hx: of  . CKD (chronic kidney disease) stage 4, GFR 15-29 ml/min (HCC) 04/15/2019  . CVA (cerebral vascular accident) ([facility])   . Diabetes mellitus without complication ([place])    denies history  . Hypertension    not taking medicine recently   Past Surgical History:  Procedure Laterality Date  . CATARACT EXTRACTION W/PHACO Left 11/16/2012   Procedure: CATARACT EXTRACTION PHACO AND INTRAOCULAR LENS PLACEMENT (IOC);  Surgeon: Adonis Brook, MD;  Location: [facility];  Service: Ophthalmology;  Laterality: Left;  . COLONOSCOPY     Hx: of  . HERNIA REPAIR      No Known Allergies  Outpatient Encounter Medications as of 04/26/2019  Medication Sig  . acetaminophen (TYLENOL) 325 MG tablet Take 2 tablets (650 mg total) by mouth every 6 (six) hours as needed for mild pain (or temp > 37.5 C (99.5 F)).  Marland Kitchen amLODipine (NORVASC) 5 MG tablet Take 1 tablet (5 mg total) by mouth daily.  Marland Kitchen aspirin EC 81 MG EC tablet Take 1 tablet (81 mg total) by mouth daily.  Marland Kitchen atorvastatin (LIPITOR) 40 MG tablet Take 1 tablet (40 mg total) by mouth daily at 6 PM.  . carboxymethylcellulose 1 % ophthalmic  solution 2 drops 4 (four) times daily as needed (Dry eyes).  . clopidogrel (PLAVIX) 75 MG tablet Take 1 tablet (75 mg total) by mouth daily for 20 days. Take for 3 weeks then stop  . feeding supplement, ENSURE ENLIVE, (ENSURE ENLIVE) LIQD Take 237 mLs by mouth 2 (two) times daily between meals.  . senna-docusate (SENOKOT-S) 8.6-50 MG tablet Take 1 tablet by mouth at bedtime as needed for mild constipation.   No facility-administered encounter medications on file as of 04/26/2019.     Review of Systems General is not complaining of fever chills says he eats well.  Skin does not complain of rashes or  itching.  Head ears eyes nose mouth and throat does not complain of visual changes or sore throat.  Respiratory denies shortness of breath or cough.  Cardiac denies chest pain or edema.  GI does not complain of any abdominal discomfort nausea vomiting diarrhea constipation.  GU no complaints of dysuria.  Musculoskeletal does not complain of joint pain.  Neurologic apparently is here for some weakness but appears to be doing well he does not complain of dizziness or headache at this point.  And psych does not complain of being depressed or anxious apparently at times has had some confusion.    There is no immunization history on file for this patient. Pertinent  Health Maintenance Due  Topic Date Due  . FOOT EXAM  08/20/1943  . OPHTHALMOLOGY EXAM  08/20/1943  . URINE MICROALBUMIN  08/20/1943  . PNA vac Low Risk Adult (1 of 2 - PCV13) 08/20/2000  . INFLUENZA VACCINE  03/04/2019  . HEMOGLOBIN A1C  10/13/2019   Fall Risk  02/23/2017  Falls in the past year? No  Comment Emmi Telephone Survey: data to providers prior to load   Functional Status Survey:    Vitals:   04/26/19 1123  BP: 120/73  Pulse: 82  Resp: 17  Temp: (!) 97.2 F (36.2 C)  TempSrc: Oral  Weight: 167 lb (75.8 kg)  Height: 5\' 6"  (1.676 m)   Body mass index is 26.95 kg/m. Physical Exam   General this is a very pleasant elderly male in no distress.  His skin is warm and dry.  Eyes visual acuity appears grossly intact sclera conjunctive are clear.  Oropharynx clear mucous membranes moist.  Chest is clear to auscultation there is no labored breathing.  Heart he has slightly distant heart sounds regular rate and rhythm without murmur gallop or rub he does not have significant lower extremity edema.  Abdomen is soft nontender with positive bowel sounds.  Musculoskeletal moves all extremities it appears x4 at baseline strength appears to be relatively  Intact.  Neurologic is grossly intact could  not really appreciate lateralizing findings his speech is clear.  Psych he appears largely alert and oriented pleasant and appropriate.  Labs.  April 24, 2019.  WBC 5.8 hemoglobin 11.1 platelets 237.  Sodium 137 potassium 5.6 BUN 40.1 creatinine 2.65.      Labs reviewed: Recent Labs    04/14/19 1759 04/17/19 0712 04/24/19  NA 139 140 137  K 4.9 4.5 5.6*  CL 110 112*  --   CO2 19* 17*  --   GLUCOSE 134* 96  --   BUN 36* 30* 48*  CREATININE 2.48* 2.21* 2.7*  CALCIUM 9.4 9.2  --    Recent Labs    04/14/19 1759  AST 17  ALT 10  ALKPHOS 88  BILITOT 0.4  PROT 7.5  ALBUMIN 3.7  Recent Labs    04/14/19 1759 04/17/19 0712 04/24/19  WBC 6.6 7.4 5.8  NEUTROABS  --   --  3  HGB 11.1* 11.6* 11.1*  HCT 34.8* 35.6* 34*  MCV 90.4 88.6  --   PLT 224 250 237   Lab Results  Component Value Date   TSH 2.569 04/15/2019   Lab Results  Component Value Date   HGBA1C 6.0 (H) 04/15/2019   Lab Results  Component Value Date   CHOL 220 (H) 04/16/2019   HDL 44 04/16/2019   LDLCALC 153 (H) 04/16/2019   TRIG 113 04/16/2019   CHOLHDL 5.0 04/16/2019    Significant Diagnostic Results in last 30 days:  Mr Angio Head Wo Contrast  Addendum Date: 04/15/2019   ADDENDUM REPORT: 04/15/2019 15:35 ADDENDUM: Study discussed by telephone with Dr. Karmen Bongo on 04/15/2019 at 1519 hours. Electronically Signed   By: Genevie Ann M.D.   On: 04/15/2019 15:35   Result Date: 04/15/2019 CLINICAL DATA:  85 year old male with dizziness found to have multiple small acute infarcts in the right cerebellum on MRI earlier today. EXAM: MRA HEAD WITHOUT CONTRAST MRA NECK WITHOUT CONTRAST TECHNIQUE: Angiographic images of the Circle of Willis were obtained using MRA technique without intravenous contrast. Angiographic images of the neck were obtained using MRA technique without intravenous contrast. Carotid stenosis measurements (when applicable) are obtained utilizing NASCET criteria, using the distal  internal carotid diameter as the denominator. COMPARISON:  Brain MRI earlier today. FINDINGS: MRA NECK FINDINGS Time-of-flight images demonstrate a 3 vessel arch configuration with occlusion of the left common carotid artery at its origin (series 4, image 148). Antegrade flow in the right CCA and both subclavian arteries. Antegrade flow continues in the right carotid through the right carotid bifurcation and in the ICA to the skull base. Mild irregularity occurs at the right ICA origin and bulb with less than 50 % stenosis with respect to the distal vessel. Absent antegrade flow throughout the left carotid to the skull base. No proximal right subclavian artery or right vertebral artery origin stenosis suspected. The right vertebral is patent to the skull base without stenosis. No proximal left subclavian or left vertebral artery origin stenosis. The left vertebral is patent with antegrade flow to the skull base. Grossly negative visible neck and upper chest. MRA HEAD FINDINGS No intracranial mass effect or ventriculomegaly. Antegrade flow in the posterior circulation. The distal vertebral arteries appear codominant and are patent to the vertebrobasilar junction. Normal right PICA origin. The left PICA has a proximal origin which is patent. Patent vertebrobasilar junction and basilar artery. Patent SCA and PCA origins with prominent bilateral posterior communicating arteries. The right PCA has a fetal type origin. Bilateral PCA branches are within normal limits. Absent flow signal in the left ICA siphon until the level of the ophthalmic and posterior communicating artery origins. Reconstituted flow signal in the distal left ICA to the terminus. The left MCA and ACA origins are normal. There is only faint asymmetrically decreased flow signal in the left MCA M1 compared to the right. The left MCA bifurcation seems to be patent, but there is asymmetrically decreased flow signal in the left M2 and M3 branches compared to  the right. Subtle decreased flow in the left A1 compared to the right. The anterior communicating artery and ACA A2 segments are within normal limits. Antegrade flow in the right ICA siphon to the terminus without stenosis. Normal right posterior communicating artery origin. Normal right MCA and ACA origins. The right  M1 and MCA bifurcation are patent. Visible MCA branches are within normal limits. IMPRESSION: 1. 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 No reconstituted flow until just before the left ICA terminus, which appears supplied by the left ophthalmic and posterior communicating arteries. 2. The left MCA and ACA remain patent although with varying degrees of asymmetrically decreased flow signal compared to the right side vessels. 3. Cervical right ICA atherosclerosis without significant stenosis. 4. No posterior circulation abnormality identified to correspond with the small right cerebellar infarcts seen on MRI earlier today. Electronically Signed: By: Genevie Ann M.D. On: 04/15/2019 15:14   Mr Angio Neck Wo Contrast  Addendum Date: 04/15/2019   ADDENDUM REPORT: 04/15/2019 15:35 ADDENDUM: Study discussed by telephone with Dr. Karmen Bongo on 04/15/2019 at 1519 hours. Electronically Signed   By: Genevie Ann M.D.   On: 04/15/2019 15:35   Result Date: 04/15/2019 CLINICAL DATA:  85 year old male with dizziness found to have multiple small acute infarcts in the right cerebellum on MRI earlier today. EXAM: MRA HEAD WITHOUT CONTRAST MRA NECK WITHOUT CONTRAST TECHNIQUE: Angiographic images of the Circle of Willis were obtained using MRA technique without intravenous contrast. Angiographic images of the neck were obtained using MRA technique without intravenous contrast. Carotid stenosis measurements (when applicable) are obtained utilizing NASCET criteria, using the distal internal carotid diameter as the denominator. COMPARISON:  Brain MRI earlier today. FINDINGS: MRA NECK FINDINGS  Time-of-flight images demonstrate a 3 vessel arch configuration with occlusion of the left common carotid artery at its origin (series 4, image 148). Antegrade flow in the right CCA and both subclavian arteries. Antegrade flow continues in the right carotid through the right carotid bifurcation and in the ICA to the skull base. Mild irregularity occurs at the right ICA origin and bulb with less than 50 % stenosis with respect to the distal vessel. Absent antegrade flow throughout the left carotid to the skull base. No proximal right subclavian artery or right vertebral artery origin stenosis suspected. The right vertebral is patent to the skull base without stenosis. No proximal left subclavian or left vertebral artery origin stenosis. The left vertebral is patent with antegrade flow to the skull base. Grossly negative visible neck and upper chest. MRA HEAD FINDINGS No intracranial mass effect or ventriculomegaly. Antegrade flow in the posterior circulation. The distal vertebral arteries appear codominant and are patent to the vertebrobasilar junction. Normal right PICA origin. The left PICA has a proximal origin which is patent. Patent vertebrobasilar junction and basilar artery. Patent SCA and PCA origins with prominent bilateral posterior communicating arteries. The right PCA has a fetal type origin. Bilateral PCA branches are within normal limits. Absent flow signal in the left ICA siphon until the level of the ophthalmic and posterior communicating artery origins. Reconstituted flow signal in the distal left ICA to the terminus. The left MCA and ACA origins are normal. There is only faint asymmetrically decreased flow signal in the left MCA M1 compared to the right. The left MCA bifurcation seems to be patent, but there is asymmetrically decreased flow signal in the left M2 and M3 branches compared to the right. Subtle decreased flow in the left A1 compared to the right. The anterior communicating artery and  ACA A2 segments are within normal limits. Antegrade flow in the right ICA siphon to the terminus without stenosis. Normal right posterior communicating artery origin. Normal right MCA and ACA origins. The right M1 and MCA bifurcation are patent. Visible MCA branches  are within normal limits. IMPRESSION: 1. 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 No reconstituted flow until just before the left ICA terminus, which appears supplied by the left ophthalmic and posterior communicating arteries. 2. The left MCA and ACA remain patent although with varying degrees of asymmetrically decreased flow signal compared to the right side vessels. 3. Cervical right ICA atherosclerosis without significant stenosis. 4. No posterior circulation abnormality identified to correspond with the small right cerebellar infarcts seen on MRI earlier today. Electronically Signed: By: Genevie Ann M.D. On: 04/15/2019 15:14   Mr Brain Wo Contrast  Result Date: 04/15/2019 CLINICAL DATA:  Dizziness for 3-4 days. EXAM: MRI HEAD WITHOUT CONTRAST TECHNIQUE: Multiplanar, multiecho pulse sequences of the brain and surrounding structures were obtained without intravenous contrast. COMPARISON:  None. FINDINGS: BRAIN: There are multiple small acute infarcts of the right cerebellar hemisphere. No acute ischemia elsewhere in the brain. Multifocal white matter hyperintensity, most commonly due to chronic ischemic microangiopathy. There is generalized atrophy without lobar predilection. The midline structures are normal. There is an old left cerebellar infarct. VASCULAR: The major intracranial arterial and venous sinus flow voids are normal. Single focus of chronic microhemorrhage in the left hemisphere no acute hemorrhage. SKULL AND UPPER CERVICAL SPINE: Calvarial bone marrow signal is normal. There is no skull base mass. The visualized upper cervical spine and soft tissues are normal. SINUSES/ORBITS: There are no fluid levels or  advanced mucosal thickening. The mastoid air cells and middle ear cavities are free of fluid. The orbits are normal. IMPRESSION: 1. 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 2. No hemorrhage or mass effect. 3. Atrophy and chronic microvascular ischemia. Electronically Signed   By: Ulyses Jarred M.D.   On: 04/15/2019 04:48    Assessment/Plan  #1 history of chronic kidney disease with some mild hyperkalemia- he did receive Kayexalate x3 we will update a metabolic panel tomorrow to keep an eye on this-clinically he appears to be stable in this regard.  2 history of CVA continues on aspirin and Plavix for 3 weeks and then will be on aspirin alone he appears to be doing well in this regards as well.  3.  History of hypertension he is on Norvasc 5 mg a day as noted above this appears to be stable.  4.  History of type 2 diabetes this appears to be diet-controlled- CBG was 75 this morning and at bedtime snack will need to be encouraged.  [id number]

## 2019-04-28 ENCOUNTER — Encounter: Payer: Self-pay | Admitting: Internal Medicine

## 2019-05-01 ENCOUNTER — Non-Acute Institutional Stay (SKILLED_NURSING_FACILITY): Payer: Medicare Other | Admitting: Internal Medicine

## 2019-05-01 DIAGNOSIS — F0280 Dementia in other diseases classified elsewhere without behavioral disturbance: Secondary | ICD-10-CM

## 2019-05-01 DIAGNOSIS — I639 Cerebral infarction, unspecified: Secondary | ICD-10-CM | POA: Diagnosis not present

## 2019-05-01 DIAGNOSIS — G301 Alzheimer's disease with late onset: Secondary | ICD-10-CM

## 2019-05-01 DIAGNOSIS — Z8673 Personal history of transient ischemic attack (TIA), and cerebral infarction without residual deficits: Secondary | ICD-10-CM | POA: Diagnosis not present

## 2019-05-01 DIAGNOSIS — E1149 Type 2 diabetes mellitus with other diabetic neurological complication: Secondary | ICD-10-CM | POA: Diagnosis not present

## 2019-05-01 DIAGNOSIS — I10 Essential (primary) hypertension: Secondary | ICD-10-CM | POA: Diagnosis not present

## 2019-05-01 DIAGNOSIS — E785 Hyperlipidemia, unspecified: Secondary | ICD-10-CM

## 2019-05-01 NOTE — Progress Notes (Signed)
Location:  [facility] Room Number: 110-P Place of Service:  SNF (31)  PCP: Hennie Duos, MD Patient Care Team: Hennie Duos, MD as PCP - General (Internal Medicine)  Extended Emergency Contact Information Primary Emergency Contact: Meras,Jennett Address: [place] [address] [address] [place] Phone: [phone] Mobile Phone: [phone] Relation: Spouse  No Known Allergies  Chief Complaint  Patient presents with   Discharge Note    Patient is seen for discharge, is scheduled to discharge 05/03/19.    HPI:  85 y.o. male seen today for discharge from facility this is scheduled for Wednesday, September 30.  He will be going home and will be with his wife he will need PT and OT and ST and eventually will need home health but apparently he needs to be evaluated by PT first-.  He has done quite well and was here for short-term rehab after hospitalization with what was thought to be an acute CVA.  He also has a history hypertension as well as diabetes.    Really been any issues here he is progressed with therapy and it appears to have gotten stronger.  Regards to CVA he is on aspirin and will also be on Plavix it appears for a course of 20 days.    His diabetes is diet controlled and CBGs appear to be stable from what I been able to assess have been in the lower 100s.  He does have a history of chronic kidney disease as well with baseline creatinine in the twos- it was in the higher twos when he first came here most recent lab actually done today shows that is down to 2.01 with a BUN of 39.  He did have some mild hyperkalemia and did receive Kayexalate which brought it down to the mid fours it was 4.6 on the lab done today it was 4.4 on September 24  On an earlier lab it was up to 5.6 and this is why received the Kayexalate.  Blood pressures appear to be stable he is on low-dose Norvasc 5 mg a day  recent blood pressures 134/76-128/72-131/66 there is some recommendation for permissive hypertension but these actually have been quite stable in the facility.  He also has a history of hyperlipidemia and his Lipitor was increased in the hospital his LDL in the hospital was 153 this will warrant follow-up by a primary care provider.  He also has a diagnosis it appears of dementia he appears to do well but his BiIM score actually was 11 out of 30  Currently he is eating his dinner he is bright alert conversational appears to be doing quite well he is looking forward to going home in fact he was anticipating going home almost since he first walked in the door here    Past Medical History:  Diagnosis Date   Cataracts, both eyes    Hx: of   CKD (chronic kidney disease) stage 4, GFR 15-29 ml/min ([facility]) 04/15/2019   CVA (cerebral vascular accident) ([facility])    Diabetes mellitus without complication ([facility])    denies history   Hypertension    not taking medicine recently    Past Surgical History:  Procedure Laterality Date   CATARACT EXTRACTION W/PHACO Left 11/16/2012   Procedure: CATARACT EXTRACTION PHACO AND INTRAOCULAR LENS PLACEMENT ([place]);  Surgeon: Adonis Brook, MD;  Location: [facility];  Service: Ophthalmology;  Laterality: Left;  COLONOSCOPY     Hx: of   HERNIA REPAIR       reports that he quit smoking about 35 years ago. He has never used smokeless tobacco. He reports current alcohol use. He reports that he does not use drugs. Social History   Socioeconomic History   Marital status: Married    Spouse name: Not on file   Number of children: Not on file   Years of education: Not on file   Highest education level: Not on file  Occupational History   Occupation: retired  Scientist, product/process development strain: Not on file   Food insecurity    Worry: Not on file    Inability: Not on file   Transportation needs    Medical: Not on file    Non-medical: Not on file   Tobacco Use   Smoking status: Former Smoker    Quit date: 12/02/1983    Years since quitting: 35.4   Smokeless tobacco: Never Used  Substance and Sexual Activity   Alcohol use: Yes    Alcohol/week: 0.0 standard drinks    Comment: occasional - 2-3 beers on the weekend   Drug use: No   Sexual activity: Not on file  Lifestyle   Physical activity    Days per week: Not on file    Minutes per session: Not on file   Stress: Not on file  Relationships   Social connections    Talks on phone: Not on file    Gets together: Not on file    Attends religious service: Not on file    Active member of club or organization: Not on file    Attends meetings of clubs or organizations: Not on file    Relationship status: Not on file   Intimate partner violence    Fear of current or ex partner: Not on file    Emotionally abused: Not on file    Physically abused: Not on file    Forced sexual activity: Not on file  Other Topics Concern   Not on file  Social History Narrative   Married, retired.  Former smoker.  Drinks 2-3 beers weekend.     Pertinent  Health Maintenance Due  Topic Date Due   FOOT EXAM  08/20/1943   OPHTHALMOLOGY EXAM  08/20/1943   URINE MICROALBUMIN  08/20/1943   PNA vac Low Risk Adult (1 of 2 - PCV13) 08/20/2000   INFLUENZA VACCINE  03/04/2019   HEMOGLOBIN A1C  10/13/2019    Medications: Allergies as of 05/01/2019   No Known Allergies     Medication List       Accurate as of May 01, 2019 11:59 PM. If you have any questions, ask your nurse or doctor.        acetaminophen 325 MG tablet Commonly known as: TYLENOL Take 2 tablets (650 mg total) by mouth every 6 (six) hours as needed for mild pain (or temp > 37.5 C (99.5 F)).   amLODipine 5 MG tablet Commonly known as: NORVASC Take 1 tablet (5 mg total) by mouth daily.   aspirin 81 MG EC tablet Take 1 tablet (81 mg total) by mouth daily.   atorvastatin 40 MG tablet Commonly known as:  LIPITOR Take 1 tablet (40 mg total) by mouth daily at 6 PM.   carboxymethylcellulose 1 % ophthalmic solution 2 drops 4 (four) times daily as needed (Dry eyes).   clopidogrel 75 MG tablet Commonly known as: PLAVIX Take 1 tablet (75 mg  total) by mouth daily for 20 days. Take for 3 weeks then stop   feeding supplement (ENSURE ENLIVE) Liqd Take 237 mLs by mouth 2 (two) times daily between meals.   senna-docusate 8.6-50 MG tablet Commonly known as: Senokot-S Take 1 tablet by mouth at bedtime as needed for mild constipation.        Review of systems.  In general he is not complaining of any fever or chills he is looking forward to going home-- thinks he is doing quite well and nursing concurs with this.  Skin he does not complain of rashes itching or diaphoresis.  Head ears eyes nose mouth and throat is not complaining of any sore throat or visual changes.  Respiratory does not complain of being short of breath or having a cough.  Cardiac does not complain of chest pain or significant edema.  GI appears to have a good appetite does not complain of abdominal discomfort nausea vomiting diarrhea constipation.  GU no complaints of dysuria.  Musculoskeletal appears to be doing well strength wise still has some weakness but has made gains he is able to stand without assistance and ambulate  Neurologic as noted above he does not really complain of dizziness or headaches or syncope or numbness.  Psych he does not complain of being depressed or anxious apparently he does have some cognitive deficits but appears to do well and function quite reasonably    Vitals:   05/02/19 1335  BP: 135/71  Pulse: 78  Resp: 18  Temp: (!) 97.1 F (36.2 C)  TempSrc: Oral  SpO2: 98%  Weight: 161 lb (73 kg)  Height: 5\' 6"  (1.676 m)   Body mass index is 25.99 kg/m.  Physical Exam  GENERAL APPEARANCE: Alert, conversant. No acute distress. Skin-is warm and dry HEENT: Unremarkable.   Oropharynx clear mucous membranes moist tongue is midline  RESPIRATORY: Breathing is even, unlabored. Lung sounds are clear   CARDIOVASCULAR: Heart RRR no murmurs, rubs or gallops. No peripheral edema.  GASTROINTESTINAL: Abdomen is soft, non-tender, not distended w/ normal bowel sounds.  NEUROLOGIC: Cranial nerves 2-12 grossly intact. Moves all extremities he is able to stand without assistance and ambulate Psych he is pleasant and appropriate does apparently have some cognitive deficits but does quite well functionally  Labs reviewed: May 01, 2019.  Sodium 140 potassium 4.6 BUN 39.3 creatinine 2.01.  April 27, 2019.  Sodium 142 potassium 4.4 BUN 44.4 creatinine 2.22.    Basic Metabolic Panel: Recent Labs    04/14/19 1759 04/17/19 0712 04/24/19  NA 139 140 137  K 4.9 4.5 5.6*  CL 110 112*  --   CO2 19* 17*  --   GLUCOSE 134* 96  --   BUN 36* 30* 48*  CREATININE 2.48* 2.21* 2.7*  CALCIUM 9.4 9.2  --    No results found for: [facility] Liver Function Tests: Recent Labs    04/14/19 1759  AST 17  ALT 10  ALKPHOS 88  BILITOT 0.4  PROT 7.5  ALBUMIN 3.7   Recent Labs    04/14/19 1759  LIPASE 31   No results for input(s): AMMONIA in the last 8760 hours. CBC: Recent Labs    04/14/19 1759 04/17/19 0712 04/24/19  WBC 6.6 7.4 5.8  NEUTROABS  --   --  3  HGB 11.1* 11.6* 11.1*  HCT 34.8* 35.6* 34*  MCV 90.4 88.6  --   PLT 224 250 237   Lipid Recent Labs    04/16/19 0359  CHOL  220*  HDL 44  LDLCALC 153*  TRIG 113   Cardiac Enzymes: No results for input(s): CKTOTAL, CKMB, CKMBINDEX, TROPONINI in the last 8760 hours. BNP: No results for input(s): BNP in the last 8760 hours. CBG: Recent Labs    04/18/19 2134 04/19/19 0653 04/19/19 1226  GLUCAP 102* 79 100*    Procedures and Imaging Studies During Stay: Mr Angio Head Wo Contrast  Addendum Date: 04/15/2019   ADDENDUM REPORT: 04/15/2019 15:35 ADDENDUM: Study discussed by telephone with Dr.  Karmen Bongo on 04/15/2019 at 1519 hours. Electronically Signed   By: Genevie Ann M.D.   On: 04/15/2019 15:35   Result Date: 04/15/2019 CLINICAL DATA:  85 year old male with dizziness found to have multiple small acute infarcts in the right cerebellum on MRI earlier today. EXAM: MRA HEAD WITHOUT CONTRAST MRA NECK WITHOUT CONTRAST TECHNIQUE: Angiographic images of the Circle of Willis were obtained using MRA technique without intravenous contrast. Angiographic images of the neck were obtained using MRA technique without intravenous contrast. Carotid stenosis measurements (when applicable) are obtained utilizing NASCET criteria, using the distal internal carotid diameter as the denominator. COMPARISON:  Brain MRI earlier today. FINDINGS: MRA NECK FINDINGS Time-of-flight images demonstrate a 3 vessel arch configuration with occlusion of the left common carotid artery at its origin (series 4, image 148). Antegrade flow in the right CCA and both subclavian arteries. Antegrade flow continues in the right carotid through the right carotid bifurcation and in the ICA to the skull base. Mild irregularity occurs at the right ICA origin and bulb with less than 50 % stenosis with respect to the distal vessel. Absent antegrade flow throughout the left carotid to the skull base. No proximal right subclavian artery or right vertebral artery origin stenosis suspected. The right vertebral is patent to the skull base without stenosis. No proximal left subclavian or left vertebral artery origin stenosis. The left vertebral is patent with antegrade flow to the skull base. Grossly negative visible neck and upper chest. MRA HEAD FINDINGS No intracranial mass effect or ventriculomegaly. Antegrade flow in the posterior circulation. The distal vertebral arteries appear codominant and are patent to the vertebrobasilar junction. Normal right PICA origin. The left PICA has a proximal origin which is patent. Patent vertebrobasilar junction and  basilar artery. Patent SCA and PCA origins with prominent bilateral posterior communicating arteries. The right PCA has a fetal type origin. Bilateral PCA branches are within normal limits. Absent flow signal in the left ICA siphon until the level of the ophthalmic and posterior communicating artery origins. Reconstituted flow signal in the distal left ICA to the terminus. The left MCA and ACA origins are normal. There is only faint asymmetrically decreased flow signal in the left MCA M1 compared to the right. The left MCA bifurcation seems to be patent, but there is asymmetrically decreased flow signal in the left M2 and M3 branches compared to the right. Subtle decreased flow in the left A1 compared to the right. The anterior communicating artery and ACA A2 segments are within normal limits. Antegrade flow in the right ICA siphon to the terminus without stenosis. Normal right posterior communicating artery origin. Normal right MCA and ACA origins. The right M1 and MCA bifurcation are patent. Visible MCA branches are within normal limits. IMPRESSION: 1. 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 No reconstituted flow until just before the left ICA terminus, which appears supplied by the left ophthalmic and posterior communicating arteries. 2. The left MCA and ACA remain  patent although with varying degrees of asymmetrically decreased flow signal compared to the right side vessels. 3. Cervical right ICA atherosclerosis without significant stenosis. 4. No posterior circulation abnormality identified to correspond with the small right cerebellar infarcts seen on MRI earlier today. Electronically Signed: By: Genevie Ann M.D. On: 04/15/2019 15:14   Mr Angio Neck Wo Contrast  Addendum Date: 04/15/2019   ADDENDUM REPORT: 04/15/2019 15:35 ADDENDUM: Study discussed by telephone with Dr. Karmen Bongo on 04/15/2019 at 1519 hours. Electronically Signed   By: Genevie Ann M.D.   On: 04/15/2019 15:35    Result Date: 04/15/2019 CLINICAL DATA:  85 year old male with dizziness found to have multiple small acute infarcts in the right cerebellum on MRI earlier today. EXAM: MRA HEAD WITHOUT CONTRAST MRA NECK WITHOUT CONTRAST TECHNIQUE: Angiographic images of the Circle of Willis were obtained using MRA technique without intravenous contrast. Angiographic images of the neck were obtained using MRA technique without intravenous contrast. Carotid stenosis measurements (when applicable) are obtained utilizing NASCET criteria, using the distal internal carotid diameter as the denominator. COMPARISON:  Brain MRI earlier today. FINDINGS: MRA NECK FINDINGS Time-of-flight images demonstrate a 3 vessel arch configuration with occlusion of the left common carotid artery at its origin (series 4, image 148). Antegrade flow in the right CCA and both subclavian arteries. Antegrade flow continues in the right carotid through the right carotid bifurcation and in the ICA to the skull base. Mild irregularity occurs at the right ICA origin and bulb with less than 50 % stenosis with respect to the distal vessel. Absent antegrade flow throughout the left carotid to the skull base. No proximal right subclavian artery or right vertebral artery origin stenosis suspected. The right vertebral is patent to the skull base without stenosis. No proximal left subclavian or left vertebral artery origin stenosis. The left vertebral is patent with antegrade flow to the skull base. Grossly negative visible neck and upper chest. MRA HEAD FINDINGS No intracranial mass effect or ventriculomegaly. Antegrade flow in the posterior circulation. The distal vertebral arteries appear codominant and are patent to the vertebrobasilar junction. Normal right PICA origin. The left PICA has a proximal origin which is patent. Patent vertebrobasilar junction and basilar artery. Patent SCA and PCA origins with prominent bilateral posterior communicating arteries. The  right PCA has a fetal type origin. Bilateral PCA branches are within normal limits. Absent flow signal in the left ICA siphon until the level of the ophthalmic and posterior communicating artery origins. Reconstituted flow signal in the distal left ICA to the terminus. The left MCA and ACA origins are normal. There is only faint asymmetrically decreased flow signal in the left MCA M1 compared to the right. The left MCA bifurcation seems to be patent, but there is asymmetrically decreased flow signal in the left M2 and M3 branches compared to the right. Subtle decreased flow in the left A1 compared to the right. The anterior communicating artery and ACA A2 segments are within normal limits. Antegrade flow in the right ICA siphon to the terminus without stenosis. Normal right posterior communicating artery origin. Normal right MCA and ACA origins. The right M1 and MCA bifurcation are patent. Visible MCA branches are within normal limits. IMPRESSION: 1. 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 No reconstituted flow until just before the left ICA terminus, which appears supplied by the left ophthalmic and posterior communicating arteries. 2. The left MCA and ACA remain patent although with varying degrees of asymmetrically decreased flow  signal compared to the right side vessels. 3. Cervical right ICA atherosclerosis without significant stenosis. 4. No posterior circulation abnormality identified to correspond with the small right cerebellar infarcts seen on MRI earlier today. Electronically Signed: By: Genevie Ann M.D. On: 04/15/2019 15:14   Mr Brain Wo Contrast  Result Date: 04/15/2019 CLINICAL DATA:  Dizziness for 3-4 days. EXAM: MRI HEAD WITHOUT CONTRAST TECHNIQUE: Multiplanar, multiecho pulse sequences of the brain and surrounding structures were obtained without intravenous contrast. COMPARISON:  None. FINDINGS: BRAIN: There are multiple small acute infarcts of the right cerebellar  hemisphere. No acute ischemia elsewhere in the brain. Multifocal white matter hyperintensity, most commonly due to chronic ischemic microangiopathy. There is generalized atrophy without lobar predilection. The midline structures are normal. There is an old left cerebellar infarct. VASCULAR: The major intracranial arterial and venous sinus flow voids are normal. Single focus of chronic microhemorrhage in the left hemisphere no acute hemorrhage. SKULL AND UPPER CERVICAL SPINE: Calvarial bone marrow signal is normal. There is no skull base mass. The visualized upper cervical spine and soft tissues are normal. SINUSES/ORBITS: There are no fluid levels or advanced mucosal thickening. The mastoid air cells and middle ear cavities are free of fluid. The orbits are normal. IMPRESSION: 1. 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 2. No hemorrhage or mass effect. 3. Atrophy and chronic microvascular ischemia. Electronically Signed   By: Ulyses Jarred M.D.   On: 04/15/2019 04:48    Assessment/Plan:     #1 history of CVA- MRI head showed multiple small acute infarcts of the right cerebellum; MRA head and neck significant for left common carotid artery occlusion in his origin; 2D echo with EF 50 to 55%, no evidence of embolic source LDL elevated at 153,  Per neurology recommendation for aspirin and Plavix dual therapy for 3 weeks-which will be up to October 6-after that aspirin alone.  He will need continued PT OT and ST- he will be going home with his wife he will need expedient primary care provider follow-up.  2.  History of acute metabolic encephalopathy in the hospital with delirium this resolved there is been no reoccurrence here-- he does have it appears some history of dementia- but he appears to be quite functional at this point -- again will need follow-up by primary care provider  #3 hypertension this appears stable as noted above on Norvasc 5 mg a day recommendation to titrate this up slowly  for any elevations.  4.  Hyperlipidemia LDL was in the 150s in the hospital he is now on Lipitor 40 mg a day.  5.  History of type 2 diabetes diet-controlled this appears to be stable hemoglobin A1c in the hospital was 6-blood sugars here appear to be largely in the 100s-follow-up with primary care provider as needed.  6.  History of chronic kidney disease stage IV- creatinine has been in the twos during his stay here actually has trended down it was 2.01 on today's lab with a BUN of 39- potassium was 4.6.  He does have some history of hyperkalemia and it was up to 5.6 at one point here in the facility and he did receive Kayexalate and results showed a lower potassium of 4.4-on lab today it is 4.6.  This will need monitoring as an outpatient and appears he will have follow-up with his primary care physician next Monday, October 5- if possible would like to get a lab before he visits his primary care doctor- Social worker has spoken with  home health and apparently they will try to get a lab later this week with primary care provider notified of results    Patient is being discharged with the following home health services: PT OT and ST-  Patient is being discharged with the following durable medical equipment:    Patient has been advised to f/u with their PCP in 1-2 weeks to bring them up to date on their rehab stay.  Social services at facility was responsible for arranging this appointment.  Pt was provided with a 30 day supply of prescriptions for medications and refills must be obtained from their PCP.  For controlled substances, a more limited supply may be provided adequate until PCP appointment only.      [id number] note greater than 30 minutes spent on this discharge summary- greater than 50% of time spent coordinating a plan of care for numerous diagnoses   [place] Lewis, PA-C

## 2019-05-02 ENCOUNTER — Encounter: Payer: Self-pay | Admitting: Internal Medicine

## 2019-05-03 MED ORDER — AMLODIPINE BESYLATE 5 MG PO TABS: 5.0000 mg | ORAL_TABLET | Freq: Every day | ORAL | 0 refills | Status: AC

## 2019-05-03 MED ORDER — ATORVASTATIN CALCIUM 40 MG PO TABS: 40.0000 mg | ORAL_TABLET | Freq: Every day | ORAL | 0 refills | Status: DC

## 2019-05-03 MED ORDER — CLOPIDOGREL BISULFATE 75 MG PO TABS: 75.0000 mg | ORAL_TABLET | Freq: Every day | ORAL | 0 refills | Status: AC

## 2019-05-25 ENCOUNTER — Other Ambulatory Visit: Payer: Self-pay | Admitting: Internal Medicine

## 2019-05-27 ENCOUNTER — Other Ambulatory Visit: Payer: Self-pay | Admitting: Internal Medicine

## 2019-06-24 ENCOUNTER — Other Ambulatory Visit: Payer: Self-pay | Admitting: Internal Medicine

## 2019-08-31 DIAGNOSIS — R413 Other amnesia: Secondary | ICD-10-CM | POA: Diagnosis not present

## 2019-08-31 DIAGNOSIS — I639 Cerebral infarction, unspecified: Secondary | ICD-10-CM | POA: Diagnosis not present

## 2019-08-31 DIAGNOSIS — I129 Hypertensive chronic kidney disease with stage 1 through stage 4 chronic kidney disease, or unspecified chronic kidney disease: Secondary | ICD-10-CM | POA: Diagnosis not present

## 2019-08-31 DIAGNOSIS — E782 Mixed hyperlipidemia: Secondary | ICD-10-CM | POA: Diagnosis not present

## 2019-09-09 ENCOUNTER — Other Ambulatory Visit: Payer: Self-pay | Admitting: Internal Medicine

## 2019-10-02 DIAGNOSIS — R2681 Unsteadiness on feet: Secondary | ICD-10-CM | POA: Diagnosis not present

## 2019-10-02 DIAGNOSIS — I6930 Unspecified sequelae of cerebral infarction: Secondary | ICD-10-CM | POA: Diagnosis not present

## 2019-10-14 IMAGING — MR MR MRA NECK W/O CM
1 of 3 series · 15 of 48 positions shown · non-contrast
Comparison: Brain MRI earlier today.
COMPARISON: Brain MRI earlier today.

Addendum:
CLINICAL DATA: 85-year-old male with dizziness found to have
multiple small acute infarcts in the right cerebellum on MRI earlier
today.

EXAM:
MRA HEAD WITHOUT CONTRAST
MRA NECK WITHOUT CONTRAST
TECHNIQUE: Angiographic images of the Circle of Willis were obtained using MRA
technique without intravenous contrast. Angiographic images of the
neck were obtained using MRA technique without intravenous contrast.
Carotid stenosis measurements (when applicable) are obtained
utilizing NASCET criteria, using the distal internal carotid
diameter as the denominator.

[Series 3: ax (id) · axial · 1.0mm · 0.43mm/px · z∈[-122,-49]mm · 15 of 176 slices shown]
[im 1/176]
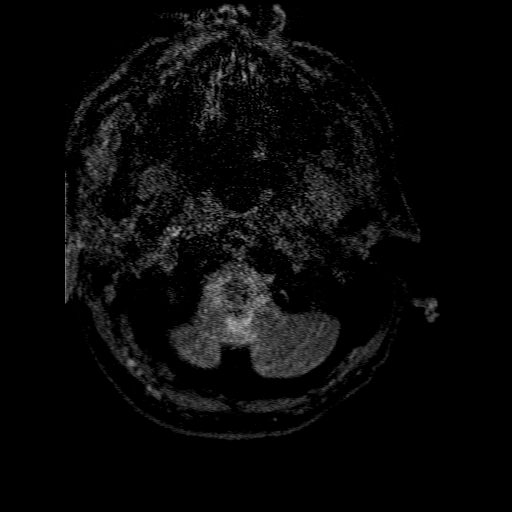
[im 9/176]
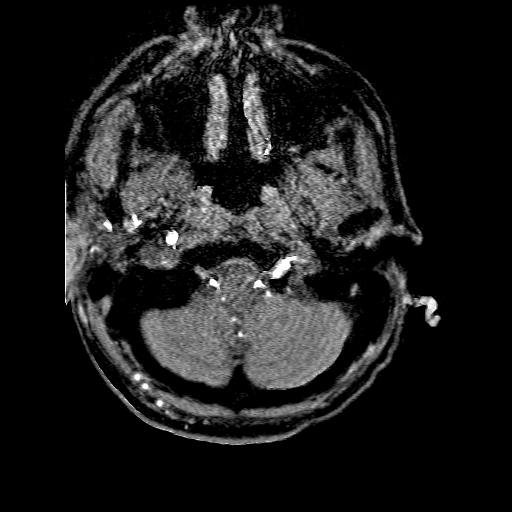
[im 17/176]
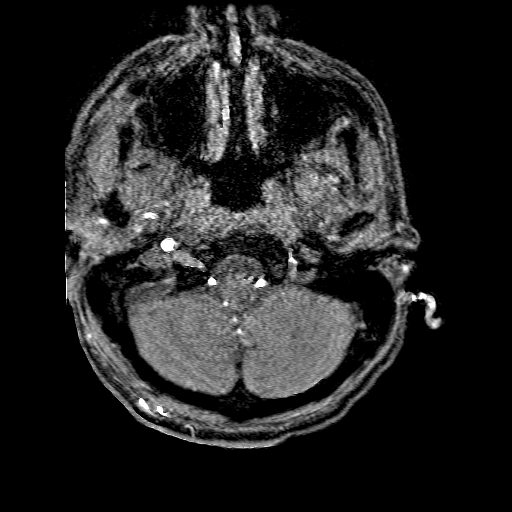
[im 26/176]
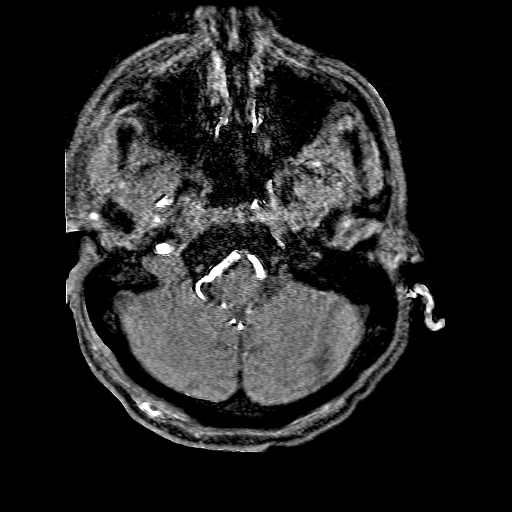
[im 34/176]
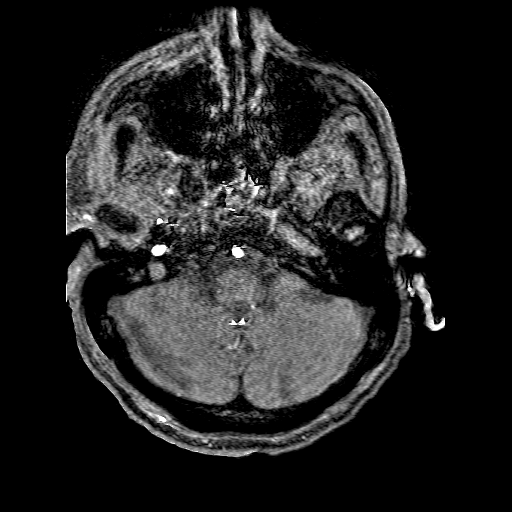
[im 42/176]
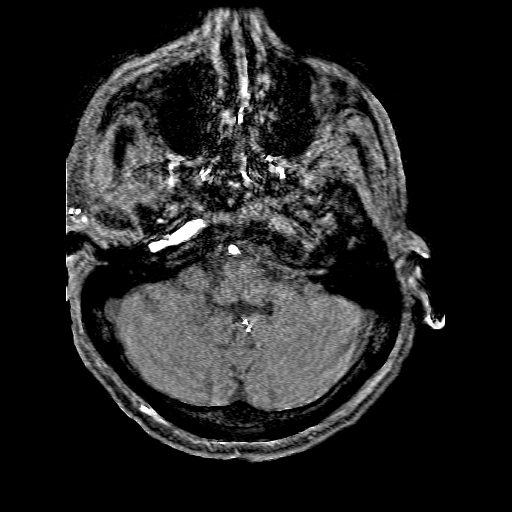
[im 51/176]
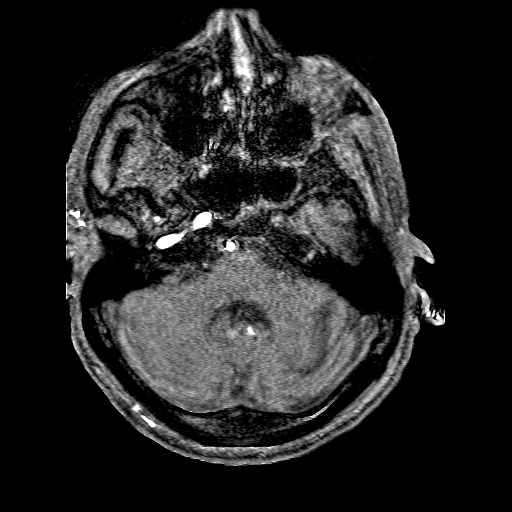
[im 59/176]
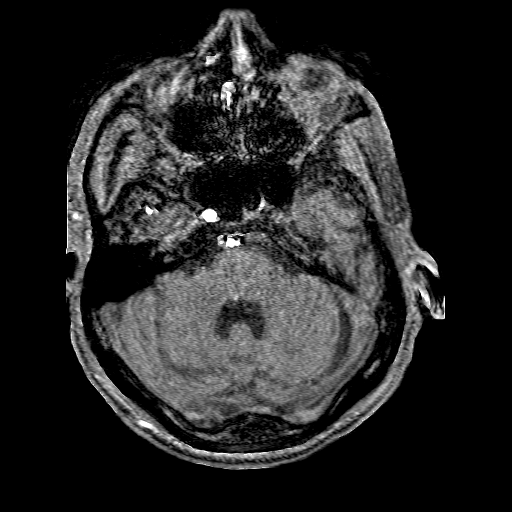
[im 76/176]
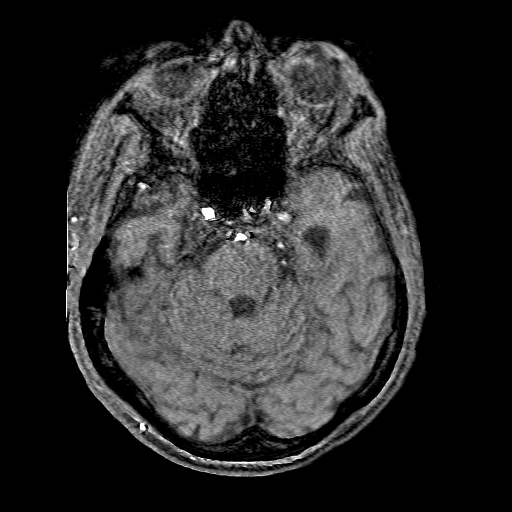
[im 92/176]
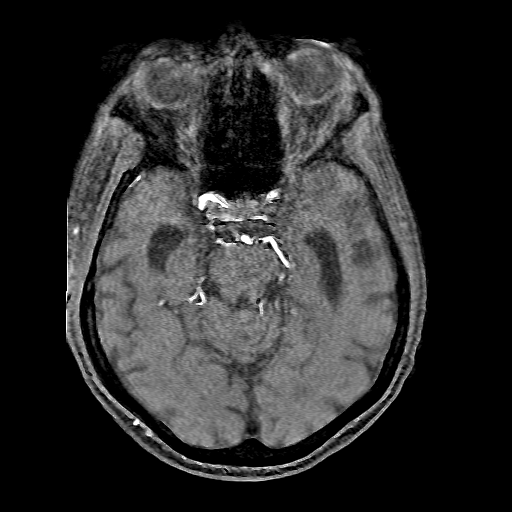
[im 101/176]
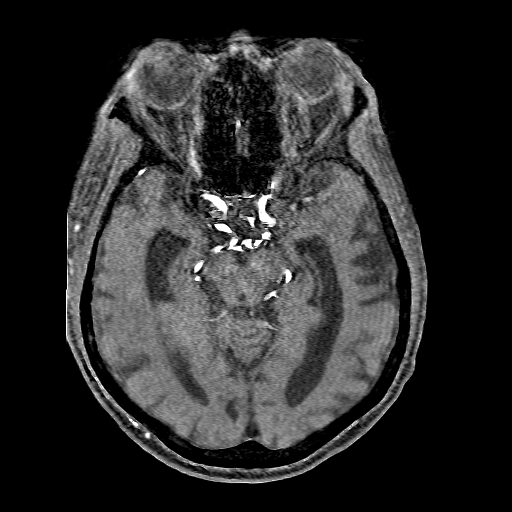
[im 126/176]
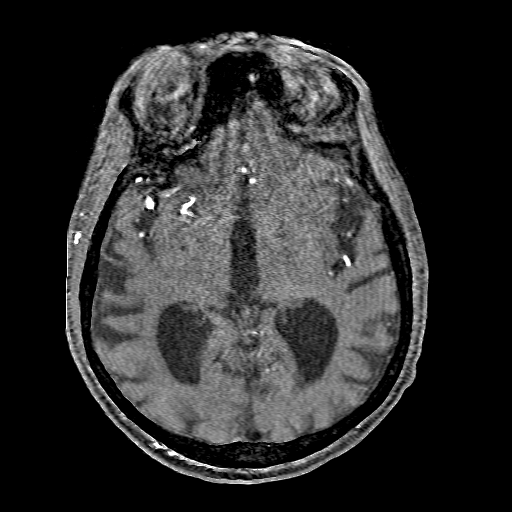
[im 142/176]
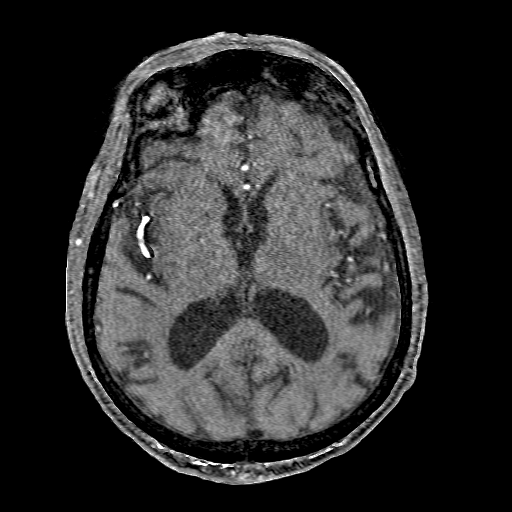
[im 151/176]
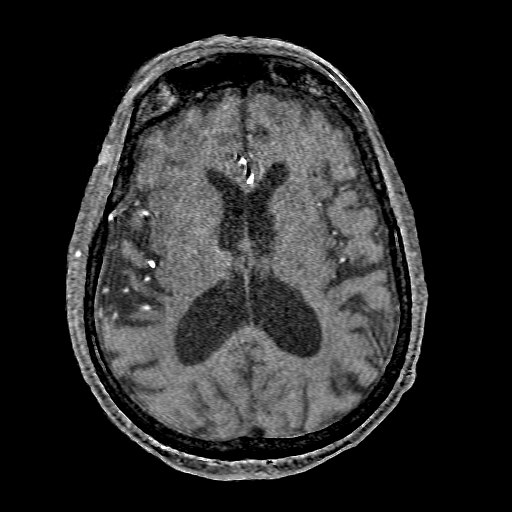
[im 167/176]
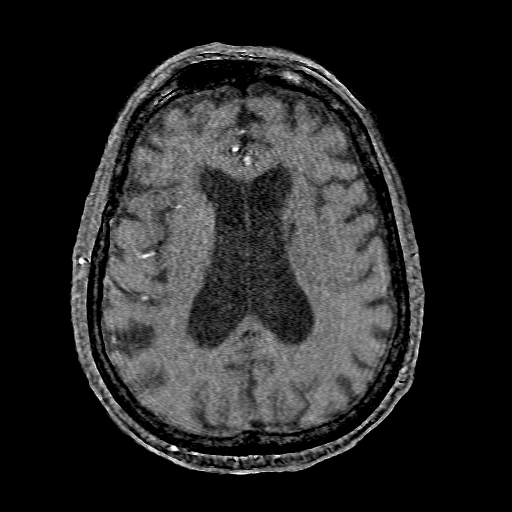

[15 of 48 positions shown; findings below may reference images not displayed]

FINDINGS: MRA NECK FINDINGS

Time-of-flight images demonstrate a 3 vessel arch configuration with
occlusion of the left common carotid artery at its origin (series 4,
image 148).

Antegrade flow in the right CCA and both subclavian arteries.

Antegrade flow continues in the right carotid through the right
carotid bifurcation and in the ICA to the skull base. Mild
irregularity occurs at the right ICA origin and bulb with less than
50 % stenosis with respect to the distal vessel.

Absent antegrade flow throughout the left carotid to the skull base.

No proximal right subclavian artery or right vertebral artery origin
stenosis suspected. The right vertebral is patent to the skull base
without stenosis. No proximal left subclavian or left vertebral
artery origin stenosis. The left vertebral is patent with antegrade
flow to the skull base.

Grossly negative visible neck and upper chest.

MRA HEAD FINDINGS

No intracranial mass effect or ventriculomegaly.

Antegrade flow in the posterior circulation. The distal vertebral
arteries appear codominant and are patent to the vertebrobasilar
junction. Normal right PICA origin. The left PICA has a proximal
origin which is patent. Patent vertebrobasilar junction and basilar
artery. Patent SCA and PCA origins with prominent bilateral
posterior communicating arteries. The right PCA has a fetal type
origin. Bilateral PCA branches are within normal limits.

Absent flow signal in the left ICA siphon until the level of the
ophthalmic and posterior communicating artery origins. Reconstituted
flow signal in the distal left ICA to the terminus. The left MCA and
ACA origins are normal. There is only faint asymmetrically decreased
flow signal in the left MCA M1 compared to the right. The left MCA
bifurcation seems to be patent, but there is asymmetrically
decreased flow signal in the left M2 and M3 branches compared to the
right.

Subtle decreased flow in the left A1 compared to the right. The
anterior communicating artery and ACA A2 segments are within normal
limits.

Antegrade flow in the right ICA siphon to the terminus without
stenosis. Normal right posterior communicating artery origin. Normal
right MCA and ACA origins. The right M1 and MCA bifurcation are
patent. Visible MCA branches are within normal limits.
IMPRESSION: 1. Positive for occlusion of the left common carotid artery at its
origin. No reconstituted flow until just before the left ICA
terminus, which appears supplied by the left ophthalmic and
posterior communicating arteries.
2. The left MCA and ACA remain patent although with varying degrees
of asymmetrically decreased flow signal compared to the right side
vessels.
3. Cervical right ICA atherosclerosis without significant stenosis.
4. No posterior circulation abnormality identified to correspond
with the small right cerebellar infarcts seen on MRI earlier today.

ADDENDUM:
Study discussed by telephone with Dr. GEC FS on 04/15/2019 at
4246 hours.

*** End of Addendum ***
FINDINGS: MRA NECK FINDINGS

Time-of-flight images demonstrate a 3 vessel arch configuration with
occlusion of the left common carotid artery at its origin (series 4,
image 148).

Antegrade flow in the right CCA and both subclavian arteries.

Antegrade flow continues in the right carotid through the right
carotid bifurcation and in the ICA to the skull base. Mild
irregularity occurs at the right ICA origin and bulb with less than
50 % stenosis with respect to the distal vessel.

Absent antegrade flow throughout the left carotid to the skull base.

No proximal right subclavian artery or right vertebral artery origin
stenosis suspected. The right vertebral is patent to the skull base
without stenosis. No proximal left subclavian or left vertebral
artery origin stenosis. The left vertebral is patent with antegrade
flow to the skull base.

Grossly negative visible neck and upper chest.

MRA HEAD FINDINGS

No intracranial mass effect or ventriculomegaly.

Antegrade flow in the posterior circulation. The distal vertebral
arteries appear codominant and are patent to the vertebrobasilar
junction. Normal right PICA origin. The left PICA has a proximal
origin which is patent. Patent vertebrobasilar junction and basilar
artery. Patent SCA and PCA origins with prominent bilateral
posterior communicating arteries. The right PCA has a fetal type
origin. Bilateral PCA branches are within normal limits.

Absent flow signal in the left ICA siphon until the level of the
ophthalmic and posterior communicating artery origins. Reconstituted
flow signal in the distal left ICA to the terminus. The left MCA and
ACA origins are normal. There is only faint asymmetrically decreased
flow signal in the left MCA M1 compared to the right. The left MCA
bifurcation seems to be patent, but there is asymmetrically
decreased flow signal in the left M2 and M3 branches compared to the
right.

Subtle decreased flow in the left A1 compared to the right. The
anterior communicating artery and ACA A2 segments are within normal
limits.

Antegrade flow in the right ICA siphon to the terminus without
stenosis. Normal right posterior communicating artery origin. Normal
right MCA and ACA origins. The right M1 and MCA bifurcation are
patent. Visible MCA branches are within normal limits.
IMPRESSION: 1. Positive for occlusion of the left common carotid artery at its
origin. No reconstituted flow until just before the left ICA
terminus, which appears supplied by the left ophthalmic and
posterior communicating arteries.
2. The left MCA and ACA remain patent although with varying degrees
of asymmetrically decreased flow signal compared to the right side
vessels.
3. Cervical right ICA atherosclerosis without significant stenosis.
4. No posterior circulation abnormality identified to correspond
with the small right cerebellar infarcts seen on MRI earlier today.

## 2019-11-02 DIAGNOSIS — E782 Mixed hyperlipidemia: Secondary | ICD-10-CM | POA: Diagnosis not present

## 2019-11-02 DIAGNOSIS — I129 Hypertensive chronic kidney disease with stage 1 through stage 4 chronic kidney disease, or unspecified chronic kidney disease: Secondary | ICD-10-CM | POA: Diagnosis not present

## 2019-11-02 DIAGNOSIS — I6930 Unspecified sequelae of cerebral infarction: Secondary | ICD-10-CM | POA: Diagnosis not present

## 2019-11-02 DIAGNOSIS — M130 Polyarthritis, unspecified: Secondary | ICD-10-CM | POA: Diagnosis not present

## 2019-12-08 DIAGNOSIS — E1169 Type 2 diabetes mellitus with other specified complication: Secondary | ICD-10-CM | POA: Diagnosis not present

## 2019-12-08 DIAGNOSIS — N189 Chronic kidney disease, unspecified: Secondary | ICD-10-CM | POA: Diagnosis not present

## 2019-12-08 DIAGNOSIS — I10 Essential (primary) hypertension: Secondary | ICD-10-CM | POA: Diagnosis not present

## 2019-12-08 DIAGNOSIS — I509 Heart failure, unspecified: Secondary | ICD-10-CM | POA: Diagnosis not present

## 2019-12-21 DIAGNOSIS — I10 Essential (primary) hypertension: Secondary | ICD-10-CM | POA: Diagnosis not present

## 2019-12-21 DIAGNOSIS — I6930 Unspecified sequelae of cerebral infarction: Secondary | ICD-10-CM | POA: Diagnosis not present

## 2019-12-21 DIAGNOSIS — I509 Heart failure, unspecified: Secondary | ICD-10-CM | POA: Diagnosis not present

## 2020-05-06 ENCOUNTER — Ambulatory Visit (INDEPENDENT_AMBULATORY_CARE_PROVIDER_SITE_OTHER): Payer: Medicare Other | Admitting: Neurology

## 2020-05-06 ENCOUNTER — Telehealth: Payer: Self-pay | Admitting: Neurology

## 2020-05-06 ENCOUNTER — Encounter: Payer: Self-pay | Admitting: Emergency Medicine

## 2020-05-06 ENCOUNTER — Encounter: Payer: Self-pay | Admitting: *Deleted

## 2020-05-06 VITALS — BP 129/67 | HR 79 | Ht 67.0 in | Wt 163.8 lb

## 2020-05-06 DIAGNOSIS — Z8673 Personal history of transient ischemic attack (TIA), and cerebral infarction without residual deficits: Secondary | ICD-10-CM

## 2020-05-06 DIAGNOSIS — F0150 Vascular dementia without behavioral disturbance: Secondary | ICD-10-CM | POA: Diagnosis not present

## 2020-05-06 DIAGNOSIS — R0989 Other specified symptoms and signs involving the circulatory and respiratory systems: Secondary | ICD-10-CM

## 2020-05-06 DIAGNOSIS — R413 Other amnesia: Secondary | ICD-10-CM | POA: Diagnosis not present

## 2020-05-06 MED ORDER — MEMANTINE HCL 28 X 5 MG & 21 X 10 MG PO TABS: ORAL_TABLET | ORAL | 12 refills | Status: DC

## 2020-05-06 NOTE — Patient Instructions (Signed)
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  I recommend checking MRI scan of the brain, MRA of the brain and neck, EEG, memory panel labs, checking lipid profile and hemoglobin A1c.  Continue aspirin for stroke prevention with strict control of hypertension blood pressure goal below 130/90, lipids with LDL cholesterol goal below 70 mg percent and diabetes with hemoglobin A1c goal below 6.5%.  Trial of Namenda starter pack increase as tolerated to help with his cognitive impairment.  We also discussed memory compensation strategies.  I also encouraged the patient to limit his driving to familiar roads in daytime hours only and if possible to stop driving completely.  He will return for follow-up in 2 months or call earlier if necessary. Memory Compensation Strategies  1. Use "WARM" strategy.  W= write it down  A= associate it  R= repeat it  M= make a mental note  2.   You can keep a Social worker.  Use a 3-ring notebook with sections for the following: calendar, important names and phone numbers,  medications, doctors' names/phone numbers, lists/reminders, and a section to journal what you did  each day.   3.    Use a calendar to write appointments down.  4.    Write yourself a schedule for the day.  This can be placed on the calendar or in a separate section of the Memory Notebook.  Keeping a  regular schedule can help memory.  5.    Use medication organizer with sections for each day or morning/evening pills.  You may need help loading it  6.    Keep a basket, or pegboard by the door.  Place items that you need to take out with you in the basket or on the pegboard.  You may also want to  include a message board for reminders.  7.    Use sticky notes.  Place sticky notes with  reminders in a place where the task is performed.  For example: " turn off the  stove" placed by the stove, "lock the door" placed on the door at eye level, " take your medications" on  the bathroom mirror or by the place where you normally take your medications.  8.    Use alarms/timers.  Use while cooking to remind yourself to check on food or as a reminder to take your medicine, or as a  reminder to make a call, or as a reminder to perform another task, etc.

## 2020-05-06 NOTE — Progress Notes (Signed)
[facility] [address] [place]. [place] [address] [phone]       OFFICE CONSULT NOTE  [name] Date of Birth:  26-Nov-1933 Medical Record Number:  [id number]   Referring MD: Lucianne Lei  Reason for Referral: Dementia  HPI: [name] is a 86 year old pleasant African-American male seen today for initial office consultation visit for dementia.  History is obtained from the patient and his wife is accompanying her as well as referral notes from Dr. [name] and review of electronic medical records and personal review of available imaging films in PACS. 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  Since then he has had some chronic gait and balance difficulties as well as worsening memory difficulties which according to the wife were not present prior to his his stroke.  These seem to have gotten worse.  He gets disoriented and confused often.  Leg forgets recent information.  He is unable to follow directions.  He has more trouble remembering new information then remote staff.  He is still driving but wife is scared about this though he has never gotten lost yet.  She denies any headache, slurred speech, focal extremity weakness.  Is had no recent falls or injuries.  He has no prior history of significant head injury with loss of consciousness, seizures, epilepsy.  There is no family history of dementia.  He does get frustrated and agitated easily though he is not had any violent behavior.  There have been no delusions hallucinations.  Dr. [name] started him on Aricept 5 mg daily but he did not tolerate it well due to dizziness and nausea and stopped it.  It is unclear if had any work-up for reversible causes of memory loss.  He denies any recurrent stroke or TIA symptoms.  He is tolerating aspirin well without side effects.  His blood pressure is well controlled and today it is  129/67.  Sugars have also been under good control as per his wife. ROS:   14 system review of systems is positive for memory loss, disorientation, agitation, irritability, frustration and all other systems negative  PMH:  Past Medical History:  Diagnosis Date  . Arthritis   . Cataracts, both eyes    Hx: of  . Cerebral infarction ([place])   . CKD (chronic kidney disease) stage 4, GFR 15-29 ml/min ([facility]CC) 04/15/2019  . CVA (cerebral vascular accident) ([place])   . Diabetes mellitus without complication ([place])    denies history  . Hypertension    not taking medicine recently  . Lipid disorder   . Loss of memory   . Renal insufficiency     Social History:  Social History   Socioeconomic History  . Marital status: Married    Spouse name: Kenneth Bennett  . Number of children: 0  . Years of education: 9  . Highest education level: Not on file  Occupational History  . Occupation: retired  Tobacco Use  . Smoking status: Former Smoker    Types: Cigarettes    Quit date: 12/02/1983    Years since quitting: 36.4  . Smokeless tobacco: Never Used  Vaping Use  . Vaping Use: Never used  Substance and Sexual Activity  . Alcohol use: Not Currently    Alcohol/week: 0.0 standard drinks  . Drug use: No  . Sexual activity: Not on file  Other Topics Concern  . Not on file  Social History Narrative  Lives with spouse   Right Handed   Drink 1 cup of caffeine daily   Competed [employer] 05/06/20   Social Determinants of Health   Financial Resource Strain:   . Difficulty of Paying Living Expenses: Not on file  Food Insecurity:   . Worried About Charity fundraiser in the Last Year: Not on file  . Ran Out of Food in the Last Year: Not on file  Transportation Needs:   . Lack of Transportation (Medical): Not on file  . Lack of Transportation (Non-Medical): Not on file  Physical Activity:   . Days of Exercise per Week: Not on file  . Minutes of Exercise per Session: Not on file  Stress:   . Feeling of  Stress : Not on file  Social Connections:   . Frequency of Communication with Friends and Family: Not on file  . Frequency of Social Gatherings with Friends and Family: Not on file  . Attends Religious Services: Not on file  . Active Member of Clubs or Organizations: Not on file  . Attends Archivist Meetings: Not on file  . Marital Status: Not on file  Intimate Partner Violence:   . Fear of Current or Ex-Partner: Not on file  . Emotionally Abused: Not on file  . Physically Abused: Not on file  . Sexually Abused: Not on file    Medications:   Current Outpatient Medications on File Prior to Visit  Medication Sig Dispense Refill  . acetaminophen (TYLENOL) 325 MG tablet Take 2 tablets (650 mg total) by mouth every 6 (six) hours as needed for mild pain (or temp > 37.5 C (99.5 F)).    Marland Kitchen aspirin EC 81 MG EC tablet Take 1 tablet (81 mg total) by mouth daily.    Marland Kitchen atorvastatin (LIPITOR) 40 MG tablet Take 1 tablet (40 mg total) by mouth daily at 6 PM. 30 tablet 0  . carboxymethylcellulose 1 % ophthalmic solution 2 drops 4 (four) times daily as needed (Dry eyes).    . feeding supplement, ENSURE ENLIVE, (ENSURE ENLIVE) LIQD Take 237 mLs by mouth 2 (two) times daily between meals. 237 mL 12  . senna-docusate (SENOKOT-S) 8.6-50 MG tablet Take 1 tablet by mouth at bedtime as needed for mild constipation.    Marland Kitchen amLODipine (NORVASC) 5 MG tablet Take 1 tablet (5 mg total) by mouth daily. 30 tablet 0   No current facility-administered medications on file prior to visit.    Allergies:  No Known Allergies  Physical Exam General: Frail elderly African-American male, seated, in no evident distress Head: head normocephalic and atraumatic.   Neck: supple with bilateral carotid bruits. Cardiovascular: regular rate and rhythm, soft ejection systolic murmur.   Musculoskeletal: no deformity Skin:  no rash/petichiae Vascular:  Normal pulses all extremities  Neurologic Exam Mental Status: Awake  and fully alert. Oriented to place and time. Recent and remote memory poor attention span, concentration and fund of knowledge diminished. Mood and affect appropriate.  Mini-Mental status exam scored 15/30 with deficits in attention, calculation, recall and orientation.  3 word recall is diminished at 0/3.  Able to name 13 animals which can walk on 4 legs.  Clock drawing score 1/4. Cranial Nerves: Fundoscopic exam not possible due to corneal opacity in the left eye.  Pupils equal, briskly reactive to light. Extraocular movements full without nystagmus. Visual fields full to confrontation. Hearing intact. Facial sensation intact.  Mild left lower facial weakness.  Tongue, palate moves normally and symmetrically.  Motor:  Normal bulk and tone. Normal strength in all tested extremity muscles. Sensory.: intact to touch , pinprick , position and vibratory sensation.  Coordination: Rapid alternating movements normal in all extremities. Finger-to-nose and heel-to-shin performed accurately bilaterally. Gait and Station: Arises from chair without difficulty. Stance is normal. Gait demonstrates normal stride length and balance .  Difficulty with tandem walking. Reflexes: 1+ and symmetric. Toes downgoing.   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  I recommend checking MRI scan of the brain, MRA of the brain and neck to evaluate bilateral  carotid bruits,, EEG, memory panel labs, checking lipid profile and hemoglobin A1c.  Continue aspirin for stroke prevention with strict control of hypertension blood pressure goal below 130/90, lipids with LDL cholesterol goal below 70 mg percent and diabetes with hemoglobin A1c goal below 6.5%.  Trial of Namenda starter pack increase as tolerated to help with his cognitive impairment.  We also discussed memory compensation strategies.  I also encouraged the patient to limit his driving to familiar roads in daytime hours only and if possible to stop driving completely.  Greater than 50% time during this 45-minute consultation visit was spent on counseling and coordination of care about his memory loss dementia and prior stroke and answering questions he will return for follow-up in 2 months or call earlier if necessary. Antony Contras, MD Note: This document was prepared with digital dictation and possible smart phrase technology. Any transcriptional errors that result from this process are unintentional.

## 2020-05-06 NOTE — Telephone Encounter (Signed)
[employer] medicare order sent to [facility]. No auth they will reach out to the patient to schedule.  

## 2020-05-07 ENCOUNTER — Other Ambulatory Visit: Payer: Self-pay | Admitting: Neurology

## 2020-05-07 LAB — HEMOGLOBIN A1C
Est. average glucose Bld gHb Est-mCnc: 137 mg/dL
Hgb A1c MFr Bld: 6.4 % — ABNORMAL HIGH (ref 4.8–5.6)

## 2020-05-07 LAB — LIPID PANEL
Chol/HDL Ratio: 3.8 ratio (ref 0.0–5.0)
Cholesterol, Total: 173 mg/dL (ref 100–199)
HDL: 45 mg/dL (ref >39)
LDL Chol Calc (NIH): 109 mg/dL — ABNORMAL HIGH (ref 0–99)
Triglycerides: 101 mg/dL (ref 0–149)
VLDL Cholesterol Cal: 19 mg/dL (ref 5–40)

## 2020-05-07 LAB — DEMENTIA PANEL
Homocysteine: 23.9 umol/L — ABNORMAL HIGH (ref 0.0–21.3)
RPR Ser Ql: NONREACTIVE
TSH: 2.96 u[IU]/mL (ref 0.450–4.500)
Vitamin B-12: 600 pg/mL (ref 232–1245)

## 2020-05-07 MED ORDER — ATORVASTATIN CALCIUM 40 MG PO TABS
80.0000 mg | ORAL_TABLET | Freq: Every day | ORAL | 0 refills | Status: DC
Start: 2020-05-07 — End: 2020-05-08

## 2020-05-07 MED ORDER — FOLIC ACID 1 MG PO TABS: 1.0000 mg | ORAL_TABLET | Freq: Every day | ORAL | 3 refills | Status: DC

## 2020-05-07 NOTE — Progress Notes (Signed)
Kindly inform the patient that his lab work shows that his homocystine level is elevated and if he is smoking he needs to quit smoking  and start taking folic acid 1 mg tablet daily which I have prescribed.  His bad cholesterol is also not optimal and I recommend he increase the dose of atorvastatin that his Lipitor to 80 mg daily.  Test for diabetes is borderline.

## 2020-05-08 ENCOUNTER — Other Ambulatory Visit: Payer: Self-pay | Admitting: Emergency Medicine

## 2020-05-08 MED ORDER — ATORVASTATIN CALCIUM 40 MG PO TABS
80.0000 mg | ORAL_TABLET | Freq: Every day | ORAL | 0 refills | Status: DC
Start: 2020-05-08 — End: 2020-05-08

## 2020-05-08 MED ORDER — ATORVASTATIN CALCIUM 40 MG PO TABS
80.0000 mg | ORAL_TABLET | Freq: Every day | ORAL | 0 refills | Status: DC
Start: 2020-05-08 — End: 2020-05-09

## 2020-05-08 NOTE — Addendum Note (Signed)
Addended by: Gildardo Griffes on: 05/08/2020 07:35 AM   Modules accepted: Orders

## 2020-05-09 ENCOUNTER — Other Ambulatory Visit: Payer: Self-pay | Admitting: Emergency Medicine

## 2020-05-09 ENCOUNTER — Telehealth: Payer: Self-pay | Admitting: Emergency Medicine

## 2020-05-09 MED ORDER — FOLIC ACID 1 MG PO TABS: 1.0000 mg | ORAL_TABLET | Freq: Every day | ORAL | 3 refills | Status: DC

## 2020-05-09 MED ORDER — ATORVASTATIN CALCIUM 40 MG PO TABS
80.0000 mg | ORAL_TABLET | Freq: Every day | ORAL | 0 refills | Status: DC
Start: 2020-05-09 — End: 2020-07-29

## 2020-05-09 NOTE — Telephone Encounter (Signed)
Called and spoke to patient's wife Janette.  Discussed Dr. Clydene Fake findings regarding patient's blood work and recommendations.  They have moved and pharmacy needed updating.  Changed pharmacy location to [place], [place] and resent prescriptions.  Wife denied that patient is smoking, stated 'He quit years ago'.   She verbalized for patient to begin taking 1 mg folic acid daily and begin taking 80mg  of lipitor daily.  She had no questions and expressed appreciation.

## 2020-05-20 ENCOUNTER — Ambulatory Visit: Payer: Medicare Other | Admitting: Neurology

## 2020-05-20 DIAGNOSIS — R413 Other amnesia: Secondary | ICD-10-CM

## 2020-05-20 DIAGNOSIS — F0150 Vascular dementia without behavioral disturbance: Secondary | ICD-10-CM

## 2020-05-20 DIAGNOSIS — R410 Disorientation, unspecified: Secondary | ICD-10-CM

## 2020-05-21 ENCOUNTER — Telehealth: Payer: Self-pay

## 2020-05-21 NOTE — Telephone Encounter (Signed)
I called pt, spoke with pt's wife Wyonia Hough per DPR. I advised her of pt's normal EEG. Pt will proceed with the MRI as scheduled for 05/26/2020. Pt's wife verbalized understanding of results and had no questions at this time.

## 2020-05-21 NOTE — Progress Notes (Signed)
Kindly inform the patient that EEG study was normal

## 2020-05-21 NOTE — Telephone Encounter (Signed)
-----   Message from Garvin Fila, MD sent at 05/21/2020  8:39 AM EDT ----- Kenneth Bennett inform the patient that EEG study was normal

## 2020-05-26 ENCOUNTER — Ambulatory Visit
Admission: RE | Admit: 2020-05-26 | Discharge: 2020-05-26 | Disposition: A | Discharge status: Home / Self Care | Payer: Medicare Other | Source: Ambulatory Visit | Attending: Neurology | Admitting: Neurology

## 2020-05-26 ENCOUNTER — Other Ambulatory Visit: Payer: Self-pay

## 2020-05-26 DIAGNOSIS — Z8673 Personal history of transient ischemic attack (TIA), and cerebral infarction without residual deficits: Secondary | ICD-10-CM

## 2020-05-26 DIAGNOSIS — R0989 Other specified symptoms and signs involving the circulatory and respiratory systems: Secondary | ICD-10-CM | POA: Diagnosis not present

## 2020-05-26 DIAGNOSIS — R413 Other amnesia: Secondary | ICD-10-CM

## 2020-05-26 MED ORDER — GADOBENATE DIMEGLUMINE 529 MG/ML IV SOLN
14.0000 mL | Freq: Once | INTRAVENOUS | Status: AC | PRN
  Administered 2020-05-26: 14 mL via INTRAVENOUS

## 2020-06-01 ENCOUNTER — Other Ambulatory Visit: Payer: Self-pay | Admitting: Neurology

## 2020-06-04 ENCOUNTER — Other Ambulatory Visit: Payer: Self-pay | Admitting: Neurology

## 2020-06-04 MED ORDER — MEMANTINE HCL 10 MG PO TABS: 10.0000 mg | ORAL_TABLET | Freq: Two times a day (BID) | ORAL | 3 refills | Status: AC

## 2020-06-04 NOTE — Telephone Encounter (Signed)
Ok to change to namenda 10 mg twice daily x 90 days

## 2020-06-14 NOTE — Progress Notes (Signed)
MR angiogram of the neck shows no significant narrowing of major blood vessels in the neck.

## 2020-06-14 NOTE — Progress Notes (Signed)
Kindly inform the patient that MRI scan of the brain shows moderate degree of shrinkage of the brain as well as small strokes in the back of the head.  No new or worrisome finding

## 2020-06-14 NOTE — Progress Notes (Signed)
Kindly inform the patient that MR angiogram of the brain and neck show old blockage of the carotid artery in the neck on the left side and only minor blockages on the right and in the brain.  No new or worrisome findings compared to previous MRA from September 2020

## 2020-06-18 ENCOUNTER — Telehealth: Payer: Self-pay | Admitting: *Deleted

## 2020-06-18 ENCOUNTER — Telehealth: Payer: Self-pay | Admitting: Emergency Medicine

## 2020-06-18 NOTE — Telephone Encounter (Signed)
Called and spoke to both patient and wife on speaker phone, went over Dr. Clydene Fake findings regarding MR Angiogram of brain, MR Angiogram of the head and neck.  Neither had any questions and expressed appreciation.

## 2020-06-18 NOTE — Telephone Encounter (Signed)
LVM requesting call back for MRI, MRA results.

## 2020-06-18 NOTE — Telephone Encounter (Signed)
-----   Message from Garvin Fila, MD sent at 06/14/2020  8:45 AM EST ----- Kindly inform the patient that MR angiogram of the brain and neck show old blockage of the carotid artery in the neck on the left side and only minor blockages on the right and in the brain.  No new or worrisome findings compared to previous MRA from September 2020

## 2020-06-19 NOTE — Telephone Encounter (Signed)
Doroteo Bradford RN called yesterday.

## 2020-07-15 ENCOUNTER — Ambulatory Visit: Payer: Medicare Other | Admitting: Neurology

## 2020-07-15 VITALS — BP 120/80 | HR 99 | Ht 67.0 in | Wt 166.0 lb

## 2020-07-15 DIAGNOSIS — F0150 Vascular dementia without behavioral disturbance: Secondary | ICD-10-CM

## 2020-07-15 MED ORDER — DONEPEZIL HCL 10 MG PO TABS: 10.0000 mg | ORAL_TABLET | Freq: Every day | ORAL | 2 refills | Status: DC

## 2020-07-15 NOTE — Patient Instructions (Signed)
I had a long discussion with the patient and his wife regarding his vascular dementia which has not shown significant improvement on Namenda but he seems to be tolerating it well without side effects. Recommend he continue Namenda 10 mg twice daily and add Aricept 5 mg daily for a month to be increased if tolerated without side effects to 10 mg daily. Continue folic acid for his elevated homocysteine and check repeat homocystine levels today. Continue aspirin for stroke prevention with strict control of hypertension with blood pressure goal below 130/90 and lipids with LDL cholesterol goal below 70 mg percent. Check urine analysis to rule out infection given his increased urinary frequency recently. He will return for follow-up in the future in 3 months with my nurse practitioner Janett Billow or call earlier if necessary.

## 2020-07-15 NOTE — Progress Notes (Signed)
[facility] [address] [place]. [place] [address] [phone]       OFFICE FOLLOW UP VISIT NOTE  [name] Date of Birth:  1933-08-05 Medical Record Number:  [id number]   Referring MD: Lucianne Lei  Reason for Referral: Dementia  [id number] visit 05/06/2020 Kenneth Bennett is a 86 year old pleasant African-American male seen today for initial office consultation visit for dementia.  History is obtained from the patient and his wife is accompanying her as well as referral notes from Dr. [name] and review of electronic medical records and personal review of available imaging films in PACS. 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  Since then he has had some chronic gait and balance difficulties as well as worsening memory difficulties which according to the wife were not present prior to his his stroke.  These seem to have gotten worse.  He gets disoriented and confused often.  Leg forgets recent information.  He is unable to follow directions.  He has more trouble remembering new information then remote staff.  He is still driving but wife is scared about this though he has never gotten lost yet.  She denies any headache, slurred speech, focal extremity weakness.  Is had no recent falls or injuries.  He has no prior history of significant head injury with loss of consciousness, seizures, epilepsy.  There is no family history of dementia.  He does get frustrated and agitated easily though he is not had any violent behavior.  There have been no delusions hallucinations.  Dr. [name] started him on Aricept 5 mg daily but he did not tolerate it well due to dizziness and nausea and stopped it.  It is unclear if had any work-up for reversible causes of memory loss.  He denies any recurrent stroke or TIA symptoms.  He is tolerating aspirin well without side effects.  His blood pressure is  well controlled and today it is 129/67.  Sugars have also been under good control as per his wife. Update 07/15/2020 ; he returns for follow-up after last visit with me 2 months ago.  Is accompanied by his wife.  She states she did not notice significant benefit after starting Namenda which she seemed to be tolerating well without side effects.  Continues to have significant short-term memory and cognitive difficulties.  He needs constant supervision.  He is however mostly independent in activities of daily living and despite being told not to is still driving.  He has had no accidents or never got loss of.  He has been having some increased urinary frequency as well as urgency recently and in fact has soiled his pants during today's visit as he felt he could not hold off to get to the restroom.  He underwent lab work at last visit which showed normal vitamin B12 TSH and RPR but homocystine was elevated at 23.9.  He has since been started on folic acid.  EEG on 05/20/2020 was normal.  MRI scan of the brain done on 05/26/2020 showed moderate generalized atrophy and changes of small vessel disease as bilateral old subcortical lacunar infarcts.  No acute abnormality.  MRI of the neck shows old chronic left common carotid artery occlusion in the neck and mild 20 to 30% right ICA stenosis in the neck..  MR angiogram of the brain shows occlusion of the left ICA in the neck with minimum stenosis of the right ICA which  is not significant.  There is moderate stenosis of distal M1 segment of the right MCA.  Patient has never tried Aricept but and is willing to try that.  ROS:   14 system review of systems is positive for memory loss, disorientation, agitation, irritability, frustration, urinary frequency, urinary urgency, incontinence and all other systems negative  PMH:  Past Medical History:  Diagnosis Date  . Arthritis   . Cataracts, both eyes    Hx: of  . Cerebral infarction ([place])   . CKD (chronic kidney  disease) stage 4, GFR 15-29 ml/min (HCC) 04/15/2019  . CVA (cerebral vascular accident) ([place])   . Diabetes mellitus without complication ([place])    denies history  . Hypertension    not taking medicine recently  . Lipid disorder   . Loss of memory   . Renal insufficiency     Social History:  Social History   Socioeconomic History  . Marital status: Married    Spouse name: Donald Prose  . Number of children: 0  . Years of education: 9  . Highest education level: Not on file  Occupational History  . Occupation: retired  Tobacco Use  . Smoking status: Former Smoker    Types: Cigarettes    Quit date: 12/02/1983    Years since quitting: 36.6  . Smokeless tobacco: Never Used  Vaping Use  . Vaping Use: Never used  Substance and Sexual Activity  . Alcohol use: Not Currently    Alcohol/week: 0.0 standard drinks  . Drug use: No  . Sexual activity: Not on file  Other Topics Concern  . Not on file  Social History Narrative   Lives with spouse   Right Handed   Drink 1 cup of caffeine daily   Competed [employer] 05/06/20   Social Determinants of Health   Financial Resource Strain: Not on file  Food Insecurity: Not on file  Transportation Needs: Not on file  Physical Activity: Not on file  Stress: Not on file  Social Connections: Not on file  Intimate Partner Violence: Not on file    Medications:   Current Outpatient Medications on File Prior to Visit  Medication Sig Dispense Refill  . acetaminophen (TYLENOL) 325 MG tablet Take 2 tablets (650 mg total) by mouth every 6 (six) hours as needed for mild pain (or temp > 37.5 C (99.5 F)).    Marland Kitchen aspirin EC 81 MG EC tablet Take 1 tablet (81 mg total) by mouth daily.    Marland Kitchen atorvastatin (LIPITOR) 40 MG tablet Take 2 tablets (80 mg total) by mouth daily at 6 PM. 60 tablet 0  . carboxymethylcellulose 1 % ophthalmic solution 2 drops 4 (four) times daily as needed (Dry eyes).    . feeding supplement, ENSURE ENLIVE, (ENSURE ENLIVE) LIQD Take 237 mLs by  mouth 2 (two) times daily between meals. 350 mL 12  . folic acid (FOLVITE) 1 MG tablet Take 1 tablet (1 mg total) by mouth daily. 30 tablet 3  . memantine (NAMENDA) 10 MG tablet Take 1 tablet (10 mg total) by mouth 2 (two) times daily. 180 tablet 3  . senna-docusate (SENOKOT-S) 8.6-50 MG tablet Take 1 tablet by mouth at bedtime as needed for mild constipation.    Marland Kitchen amLODipine (NORVASC) 5 MG tablet Take 1 tablet (5 mg total) by mouth daily. 30 tablet 0   No current facility-administered medications on file prior to visit.    Allergies:  No Known Allergies  Physical Exam General: Frail elderly African-American male, seated, in  no evident distress Head: head normocephalic and atraumatic.   Neck: supple with bilateral carotid bruits. Cardiovascular: regular rate and rhythm, soft ejection systolic murmur.   Musculoskeletal: no deformity Skin:  no rash/petichiae Vascular:  Normal pulses all extremities  Neurologic Exam Mental Status: Awake and fully alert. Oriented to place and time. Recent and remote memory poor attention span, concentration and fund of knowledge diminished. Mood and affect appropriate.  Mini-Mental status exam scored 15/30 with deficits in attention, calculation, recall and orientation.  3 word recall is diminished at 0/3.  Able to name 12 animals which can walk on 4 legs.  Clock drawing score 1/4.  Unable to copy intersecting pentagons hello Cranial Nerves: Fundoscopic exam not possible due to corneal opacity in the left eye.  Pupils equal, briskly reactive to light. Extraocular movements full without nystagmus. Visual fields full to confrontation. Hearing intact. Facial sensation intact.  Mild left lower facial weakness.  Tongue, palate moves normally and symmetrically.  Motor: Normal bulk and tone. Normal strength in all tested extremity muscles. Sensory.: intact to touch , pinprick , position and vibratory sensation.  Coordination: Rapid alternating movements normal in all  extremities. Finger-to-nose and heel-to-shin performed accurately bilaterally. Gait and Station: Arises from chair without difficulty. Stance is normal. Gait demonstrates normal stride length and balance .  Difficulty with tandem walking. Reflexes: 1+ and symmetric. Toes downgoing.       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  Known history of chronic left common carotid artery occlusion     PLAN: I had a long discussion with the patient and his wife regarding his vascular dementia which has not shown significant improvement on Namenda but he seems to be tolerating it well without side effects. Recommend he continue Namenda 10 mg twice daily and add Aricept 5 mg daily for a month to be increased if tolerated without side effects to 10 mg daily. Continue folic acid for his elevated homocysteine and check repeat homocystine levels today. Continue aspirin for stroke prevention with strict control of hypertension with blood pressure goal below 130/90 and lipids with LDL cholesterol goal below 70 mg percent. Check urine analysis to rule out infection given his increased urinary frequency recently. He will return for follow-up in the future in 3 months with my nurse practitioner Janett Billow or call earlier if necessary.impairment.  We also discussed memory compensation strategies.  I also encouraged the patient to limit his driving to familiar roads in daytime hours only and if possible to stop driving completely.  Greater than 50% time during this 35-minute  visit was spent on counseling and coordination of care about his memory loss dementia and prior stroke and answering questions . Antony Contras, MD Note: This document was prepared with  digital dictation and possible smart phrase technology. Any transcriptional errors that result from this process are unintentional.

## 2020-07-17 LAB — HOMOCYSTEINE: Homocysteine: 18.9 umol/L (ref 0.0–21.3)

## 2020-07-28 ENCOUNTER — Other Ambulatory Visit: Payer: Self-pay | Admitting: Neurology

## 2020-07-29 ENCOUNTER — Other Ambulatory Visit: Payer: Self-pay | Admitting: Neurology

## 2020-07-31 NOTE — Progress Notes (Signed)
Kindly inform the patient that follow-up homocystine level is now satisfactory.  No medication changes needed

## 2020-08-05 ENCOUNTER — Telehealth: Payer: Self-pay | Admitting: Emergency Medicine

## 2020-08-05 NOTE — Telephone Encounter (Signed)
Called and spoke to patient's wife (on [place]).  Informed that his homocystine levels are normal and he is not recommending any medication changes.

## 2020-08-05 NOTE — Telephone Encounter (Signed)
-----   Message from Garvin Fila, MD sent at 07/31/2020  5:00 PM EST ----- Kindly inform the patient that follow-up homocystine level is now satisfactory.  No medication changes needed

## 2020-08-11 ENCOUNTER — Other Ambulatory Visit: Payer: Self-pay | Admitting: Neurology

## 2020-08-14 ENCOUNTER — Telehealth: Payer: Self-pay | Admitting: Neurology

## 2020-08-14 NOTE — Telephone Encounter (Signed)
Wife is asking for a call on the status of the refill on folic acid (FOLVITE) 1 MG tablet [facility]    [id number]

## 2020-08-15 NOTE — Telephone Encounter (Signed)
Called patient's wife and she stated that Kenneth Bennett is tolerating the Aricept along with the Namenda and would like to continue him on it.  Hasn't seen much of an improvement yet but realizes he hasn't been on it that long.  Both medications have been refilled

## 2020-09-16 ENCOUNTER — Other Ambulatory Visit: Payer: Self-pay | Admitting: Neurology

## 2020-11-04 ENCOUNTER — Ambulatory Visit: Payer: Medicare Other | Admitting: Adult Health

## 2021-05-13 ENCOUNTER — Other Ambulatory Visit: Payer: Self-pay | Admitting: Neurology

## 2021-05-13 NOTE — Telephone Encounter (Signed)
Pt needs to call to make appt

## 2021-08-03 ENCOUNTER — Other Ambulatory Visit: Payer: Self-pay | Admitting: Neurology

## 2021-10-06 ENCOUNTER — Other Ambulatory Visit: Payer: Self-pay | Admitting: Neurology

## 2021-10-31 ENCOUNTER — Other Ambulatory Visit: Payer: Self-pay | Admitting: Neurology

## 2021-11-30 ENCOUNTER — Other Ambulatory Visit: Payer: Self-pay | Admitting: Neurology

## 2022-10-14 DIAGNOSIS — R0789 Other chest pain: Secondary | ICD-10-CM | POA: Diagnosis not present

## 2022-12-31 DIAGNOSIS — Z8673 Personal history of transient ischemic attack (TIA), and cerebral infarction without residual deficits: Secondary | ICD-10-CM | POA: Diagnosis not present

## 2022-12-31 DIAGNOSIS — N189 Chronic kidney disease, unspecified: Secondary | ICD-10-CM | POA: Diagnosis not present

## 2022-12-31 DIAGNOSIS — I10 Essential (primary) hypertension: Secondary | ICD-10-CM | POA: Diagnosis not present

## 2022-12-31 DIAGNOSIS — R413 Other amnesia: Secondary | ICD-10-CM | POA: Diagnosis not present

## 2022-12-31 DIAGNOSIS — M130 Polyarthritis, unspecified: Secondary | ICD-10-CM | POA: Diagnosis not present

## 2022-12-31 DIAGNOSIS — I6930 Unspecified sequelae of cerebral infarction: Secondary | ICD-10-CM | POA: Diagnosis not present

## 2022-12-31 DIAGNOSIS — E039 Hypothyroidism, unspecified: Secondary | ICD-10-CM | POA: Diagnosis not present

## 2022-12-31 DIAGNOSIS — E782 Mixed hyperlipidemia: Secondary | ICD-10-CM | POA: Diagnosis not present

## 2022-12-31 DIAGNOSIS — E889 Metabolic disorder, unspecified: Secondary | ICD-10-CM | POA: Diagnosis not present

## 2023-03-05 DIAGNOSIS — Z20822 Contact with and (suspected) exposure to covid-19: Secondary | ICD-10-CM | POA: Diagnosis not present

## 2023-04-22 DIAGNOSIS — E559 Vitamin D deficiency, unspecified: Secondary | ICD-10-CM | POA: Diagnosis not present

## 2023-04-22 DIAGNOSIS — E782 Mixed hyperlipidemia: Secondary | ICD-10-CM | POA: Diagnosis not present

## 2023-04-22 DIAGNOSIS — E1149 Type 2 diabetes mellitus with other diabetic neurological complication: Secondary | ICD-10-CM | POA: Diagnosis not present

## 2023-04-22 DIAGNOSIS — I10 Essential (primary) hypertension: Secondary | ICD-10-CM | POA: Diagnosis not present

## 2023-04-22 DIAGNOSIS — R413 Other amnesia: Secondary | ICD-10-CM | POA: Diagnosis not present

## 2023-04-22 DIAGNOSIS — Z6822 Body mass index (BMI) 22.0-22.9, adult: Secondary | ICD-10-CM | POA: Diagnosis not present

## 2023-04-22 DIAGNOSIS — E1169 Type 2 diabetes mellitus with other specified complication: Secondary | ICD-10-CM | POA: Diagnosis not present

## 2023-04-22 DIAGNOSIS — R634 Abnormal weight loss: Secondary | ICD-10-CM | POA: Diagnosis not present

## 2023-04-22 DIAGNOSIS — E1122 Type 2 diabetes mellitus with diabetic chronic kidney disease: Secondary | ICD-10-CM | POA: Diagnosis not present

## 2023-04-22 DIAGNOSIS — N289 Disorder of kidney and ureter, unspecified: Secondary | ICD-10-CM | POA: Diagnosis not present

## 2023-08-24 DIAGNOSIS — E782 Mixed hyperlipidemia: Secondary | ICD-10-CM | POA: Diagnosis not present

## 2023-08-24 DIAGNOSIS — N189 Chronic kidney disease, unspecified: Secondary | ICD-10-CM | POA: Diagnosis not present

## 2023-08-24 DIAGNOSIS — L853 Xerosis cutis: Secondary | ICD-10-CM | POA: Diagnosis not present

## 2023-08-24 DIAGNOSIS — M130 Polyarthritis, unspecified: Secondary | ICD-10-CM | POA: Diagnosis not present

## 2023-08-24 DIAGNOSIS — M1000 Idiopathic gout, unspecified site: Secondary | ICD-10-CM | POA: Diagnosis not present

## 2023-08-24 DIAGNOSIS — M10079 Idiopathic gout, unspecified ankle and foot: Secondary | ICD-10-CM | POA: Diagnosis not present

## 2023-08-24 DIAGNOSIS — F0390 Unspecified dementia without behavioral disturbance: Secondary | ICD-10-CM | POA: Diagnosis not present

## 2023-08-24 DIAGNOSIS — E1169 Type 2 diabetes mellitus with other specified complication: Secondary | ICD-10-CM | POA: Diagnosis not present

## 2023-08-24 DIAGNOSIS — I129 Hypertensive chronic kidney disease with stage 1 through stage 4 chronic kidney disease, or unspecified chronic kidney disease: Secondary | ICD-10-CM | POA: Diagnosis not present

## 2023-08-24 DIAGNOSIS — Z6824 Body mass index (BMI) 24.0-24.9, adult: Secondary | ICD-10-CM | POA: Diagnosis not present

## 2023-09-27 DIAGNOSIS — R2681 Unsteadiness on feet: Secondary | ICD-10-CM | POA: Diagnosis not present

## 2023-09-27 DIAGNOSIS — F0390 Unspecified dementia without behavioral disturbance: Secondary | ICD-10-CM | POA: Diagnosis not present

## 2023-09-27 DIAGNOSIS — I10 Essential (primary) hypertension: Secondary | ICD-10-CM | POA: Diagnosis not present

## 2023-10-13 DIAGNOSIS — I129 Hypertensive chronic kidney disease with stage 1 through stage 4 chronic kidney disease, or unspecified chronic kidney disease: Secondary | ICD-10-CM | POA: Diagnosis not present

## 2023-10-13 DIAGNOSIS — Z0001 Encounter for general adult medical examination with abnormal findings: Secondary | ICD-10-CM | POA: Diagnosis not present

## 2023-10-13 DIAGNOSIS — R9431 Abnormal electrocardiogram [ECG] [EKG]: Secondary | ICD-10-CM | POA: Diagnosis not present

## 2023-10-13 DIAGNOSIS — E1169 Type 2 diabetes mellitus with other specified complication: Secondary | ICD-10-CM | POA: Diagnosis not present

## 2023-10-13 DIAGNOSIS — M130 Polyarthritis, unspecified: Secondary | ICD-10-CM | POA: Diagnosis not present

## 2023-10-13 DIAGNOSIS — N184 Chronic kidney disease, stage 4 (severe): Secondary | ICD-10-CM | POA: Diagnosis not present

## 2023-10-13 DIAGNOSIS — F0390 Unspecified dementia without behavioral disturbance: Secondary | ICD-10-CM | POA: Diagnosis not present

## 2023-11-25 DIAGNOSIS — E1149 Type 2 diabetes mellitus with other diabetic neurological complication: Secondary | ICD-10-CM | POA: Diagnosis not present

## 2023-11-25 DIAGNOSIS — I129 Hypertensive chronic kidney disease with stage 1 through stage 4 chronic kidney disease, or unspecified chronic kidney disease: Secondary | ICD-10-CM | POA: Diagnosis not present

## 2023-11-25 DIAGNOSIS — E1122 Type 2 diabetes mellitus with diabetic chronic kidney disease: Secondary | ICD-10-CM | POA: Diagnosis not present

## 2023-11-25 DIAGNOSIS — N184 Chronic kidney disease, stage 4 (severe): Secondary | ICD-10-CM | POA: Diagnosis not present

## 2023-12-09 DIAGNOSIS — N184 Chronic kidney disease, stage 4 (severe): Secondary | ICD-10-CM | POA: Diagnosis not present

## 2023-12-09 DIAGNOSIS — I10 Essential (primary) hypertension: Secondary | ICD-10-CM | POA: Diagnosis not present

## 2023-12-14 DIAGNOSIS — N184 Chronic kidney disease, stage 4 (severe): Secondary | ICD-10-CM | POA: Diagnosis not present

## 2024-01-05 DIAGNOSIS — N184 Chronic kidney disease, stage 4 (severe): Secondary | ICD-10-CM | POA: Diagnosis not present

## 2024-01-06 DIAGNOSIS — F0390 Unspecified dementia without behavioral disturbance: Secondary | ICD-10-CM | POA: Diagnosis not present

## 2024-01-06 DIAGNOSIS — M130 Polyarthritis, unspecified: Secondary | ICD-10-CM | POA: Diagnosis not present

## 2024-01-06 DIAGNOSIS — N189 Chronic kidney disease, unspecified: Secondary | ICD-10-CM | POA: Diagnosis not present

## 2024-01-06 DIAGNOSIS — I129 Hypertensive chronic kidney disease with stage 1 through stage 4 chronic kidney disease, or unspecified chronic kidney disease: Secondary | ICD-10-CM | POA: Diagnosis not present

## 2024-01-12 DIAGNOSIS — F0390 Unspecified dementia without behavioral disturbance: Secondary | ICD-10-CM | POA: Diagnosis not present

## 2024-01-12 DIAGNOSIS — R269 Unspecified abnormalities of gait and mobility: Secondary | ICD-10-CM | POA: Diagnosis not present

## 2024-01-12 DIAGNOSIS — R42 Dizziness and giddiness: Secondary | ICD-10-CM | POA: Diagnosis not present

## 2024-01-12 DIAGNOSIS — R413 Other amnesia: Secondary | ICD-10-CM | POA: Diagnosis not present

## 2024-01-12 DIAGNOSIS — I639 Cerebral infarction, unspecified: Secondary | ICD-10-CM | POA: Diagnosis not present

## 2024-01-13 DIAGNOSIS — R413 Other amnesia: Secondary | ICD-10-CM | POA: Diagnosis not present

## 2024-01-25 DIAGNOSIS — N184 Chronic kidney disease, stage 4 (severe): Secondary | ICD-10-CM | POA: Diagnosis not present

## 2024-01-25 DIAGNOSIS — D6489 Other specified anemias: Secondary | ICD-10-CM | POA: Diagnosis not present

## 2024-01-25 DIAGNOSIS — E8722 Chronic metabolic acidosis: Secondary | ICD-10-CM | POA: Diagnosis not present

## 2024-01-25 DIAGNOSIS — I10 Essential (primary) hypertension: Secondary | ICD-10-CM | POA: Diagnosis not present

## 2024-01-26 DIAGNOSIS — R269 Unspecified abnormalities of gait and mobility: Secondary | ICD-10-CM | POA: Diagnosis not present

## 2024-01-26 DIAGNOSIS — R42 Dizziness and giddiness: Secondary | ICD-10-CM | POA: Diagnosis not present

## 2024-01-26 DIAGNOSIS — R4189 Other symptoms and signs involving cognitive functions and awareness: Secondary | ICD-10-CM | POA: Diagnosis not present

## 2024-01-26 DIAGNOSIS — F0390 Unspecified dementia without behavioral disturbance: Secondary | ICD-10-CM | POA: Diagnosis not present

## 2024-01-26 DIAGNOSIS — I639 Cerebral infarction, unspecified: Secondary | ICD-10-CM | POA: Diagnosis not present

## 2024-01-26 DIAGNOSIS — R413 Other amnesia: Secondary | ICD-10-CM | POA: Diagnosis not present

## 2024-02-17 DIAGNOSIS — R2681 Unsteadiness on feet: Secondary | ICD-10-CM | POA: Diagnosis not present

## 2024-02-17 DIAGNOSIS — M130 Polyarthritis, unspecified: Secondary | ICD-10-CM | POA: Diagnosis not present

## 2024-02-17 DIAGNOSIS — I129 Hypertensive chronic kidney disease with stage 1 through stage 4 chronic kidney disease, or unspecified chronic kidney disease: Secondary | ICD-10-CM | POA: Diagnosis not present

## 2024-02-17 DIAGNOSIS — I6930 Unspecified sequelae of cerebral infarction: Secondary | ICD-10-CM | POA: Diagnosis not present

## 2024-02-17 DIAGNOSIS — N189 Chronic kidney disease, unspecified: Secondary | ICD-10-CM | POA: Diagnosis not present

## 2024-02-17 DIAGNOSIS — E1122 Type 2 diabetes mellitus with diabetic chronic kidney disease: Secondary | ICD-10-CM | POA: Diagnosis not present

## 2024-02-17 DIAGNOSIS — R634 Abnormal weight loss: Secondary | ICD-10-CM | POA: Diagnosis not present

## 2024-02-29 DIAGNOSIS — R413 Other amnesia: Secondary | ICD-10-CM | POA: Diagnosis not present

## 2024-02-29 DIAGNOSIS — F0390 Unspecified dementia without behavioral disturbance: Secondary | ICD-10-CM | POA: Diagnosis not present

## 2024-02-29 DIAGNOSIS — R269 Unspecified abnormalities of gait and mobility: Secondary | ICD-10-CM | POA: Diagnosis not present

## 2024-02-29 DIAGNOSIS — I639 Cerebral infarction, unspecified: Secondary | ICD-10-CM | POA: Diagnosis not present

## 2024-02-29 DIAGNOSIS — R42 Dizziness and giddiness: Secondary | ICD-10-CM | POA: Diagnosis not present

## 2024-03-22 DIAGNOSIS — N189 Chronic kidney disease, unspecified: Secondary | ICD-10-CM | POA: Diagnosis not present

## 2024-03-22 DIAGNOSIS — I129 Hypertensive chronic kidney disease with stage 1 through stage 4 chronic kidney disease, or unspecified chronic kidney disease: Secondary | ICD-10-CM | POA: Diagnosis not present

## 2024-03-22 DIAGNOSIS — F0390 Unspecified dementia without behavioral disturbance: Secondary | ICD-10-CM | POA: Diagnosis not present

## 2024-03-22 DIAGNOSIS — I6930 Unspecified sequelae of cerebral infarction: Secondary | ICD-10-CM | POA: Diagnosis not present

## 2024-08-03 DEATH — deceased
